# Patient Record
Sex: Female | Born: 1995 | Race: Black or African American | Hispanic: No | Marital: Single | Smoking: Never smoker
Health system: Southern US, Community
[De-identification: ages and names within clinical notes are randomized; demographics above are authoritative.]

## PROBLEM LIST (undated history)

## (undated) DIAGNOSIS — A749 Chlamydial infection, unspecified: Secondary | ICD-10-CM

## (undated) DIAGNOSIS — B9689 Other specified bacterial agents as the cause of diseases classified elsewhere: Secondary | ICD-10-CM

## (undated) DIAGNOSIS — N760 Acute vaginitis: Secondary | ICD-10-CM

## (undated) DIAGNOSIS — R519 Headache, unspecified: Secondary | ICD-10-CM

## (undated) DIAGNOSIS — J45909 Unspecified asthma, uncomplicated: Secondary | ICD-10-CM

## (undated) HISTORY — PX: HEMORRHOID SURGERY: SHX153

---

## 2016-05-08 ENCOUNTER — Encounter (HOSPITAL_COMMUNITY): Payer: Self-pay

## 2016-05-08 ENCOUNTER — Emergency Department (HOSPITAL_COMMUNITY): Payer: Medicaid Other

## 2016-05-08 ENCOUNTER — Emergency Department (HOSPITAL_COMMUNITY)
Admission: EM | Admit: 2016-05-08 | Discharge: 2016-05-09 | Disposition: A | Discharge status: Home / Self Care | Payer: Medicaid Other | Attending: Emergency Medicine | Admitting: Emergency Medicine

## 2016-05-08 DIAGNOSIS — R0789 Other chest pain: Secondary | ICD-10-CM

## 2016-05-08 DIAGNOSIS — R079 Chest pain, unspecified: Secondary | ICD-10-CM | POA: Diagnosis present

## 2016-05-08 DIAGNOSIS — J45909 Unspecified asthma, uncomplicated: Secondary | ICD-10-CM | POA: Insufficient documentation

## 2016-05-08 HISTORY — DX: Unspecified asthma, uncomplicated: J45.909

## 2016-05-08 LAB — CBC
HEMATOCRIT: 36.9 % (ref 36.0–46.0)
Hemoglobin: 11.8 g/dL — ABNORMAL LOW (ref 12.0–15.0)
MCH: 24.9 pg — ABNORMAL LOW (ref 26.0–34.0)
MCHC: 32 g/dL (ref 30.0–36.0)
MCV: 77.8 fL — ABNORMAL LOW (ref 78.0–100.0)
PLATELETS: 99 10*3/uL — AB (ref 150–400)
RBC: 4.74 MIL/uL (ref 3.87–5.11)
RDW: 14.5 % (ref 11.5–15.5)
WBC: 4.9 10*3/uL (ref 4.0–10.5)

## 2016-05-08 LAB — BASIC METABOLIC PANEL
Anion gap: 9 (ref 5–15)
BUN: 11 mg/dL (ref 6–20)
CO2: 21 mmol/L — ABNORMAL LOW (ref 22–32)
CREATININE: 0.85 mg/dL (ref 0.44–1.00)
Calcium: 9.3 mg/dL (ref 8.9–10.3)
Chloride: 108 mmol/L (ref 101–111)
Glucose, Bld: 93 mg/dL (ref 65–99)
POTASSIUM: 4 mmol/L (ref 3.5–5.1)
SODIUM: 138 mmol/L (ref 135–145)

## 2016-05-08 LAB — URINALYSIS, ROUTINE W REFLEX MICROSCOPIC
BILIRUBIN URINE: NEGATIVE
GLUCOSE, UA: NEGATIVE mg/dL
KETONES UR: 15 mg/dL — AB
Leukocytes, UA: NEGATIVE
Nitrite: NEGATIVE
PROTEIN: NEGATIVE mg/dL
Specific Gravity, Urine: 1.029 (ref 1.005–1.030)
pH: 6 (ref 5.0–8.0)

## 2016-05-08 LAB — URINE MICROSCOPIC-ADD ON

## 2016-05-08 LAB — POC URINE PREG, ED: PREG TEST UR: NEGATIVE

## 2016-05-08 LAB — I-STAT TROPONIN, ED: Troponin i, poc: 0.01 ng/mL (ref 0.00–0.08)

## 2016-05-08 NOTE — ED Triage Notes (Addendum)
Pt states that she has been having CP that has been going on for a while, worse today. Pt states that she was seen in the ER two weeks ago and sent to a pulmonary specialist. Also having SOB with some wheezing. Denies radiation or n/v. Pt also adds that she has been having vaginal bleeding for the past month, recently had BV and wants to be checked for it again

## 2016-05-09 MED ORDER — IBUPROFEN 600 MG PO TABS: 600.0000 mg | ORAL_TABLET | Freq: Three times a day (TID) | ORAL | 0 refills | Status: AC

## 2016-05-09 MED ORDER — IBUPROFEN 400 MG PO TABS
600.0000 mg | ORAL_TABLET | Freq: Once | ORAL | Status: AC
  Administered 2016-05-09: 600 mg via ORAL
  Filled 2016-05-09: qty 1

## 2016-05-09 MED ORDER — METRONIDAZOLE 500 MG PO TABS
500.0000 mg | ORAL_TABLET | Freq: Once | ORAL | Status: AC
  Administered 2016-05-09: 500 mg via ORAL
  Filled 2016-05-09: qty 1

## 2016-05-09 MED ORDER — METRONIDAZOLE 500 MG PO TABS: 500.0000 mg | ORAL_TABLET | Freq: Two times a day (BID) | ORAL | 0 refills | Status: DC

## 2016-05-09 NOTE — ED Provider Notes (Signed)
[facility] Provider Note   CSN: [id number] Arrival date & time: 05/08/16  2245     History   Chief Complaint Chief Complaint  Patient presents with  . Asthma  . Chest Pain    HPI Anna Glenn is a 20 y.o. female.  HPI Patient presents with concern of chest wall pain, dyspnea. She also has complaint of vaginal discharge. Patient has a history of asthma, takes multiple medication, saw her pulmonologist within the past month. She notes that over the past few days, with increased band practice at [facility] she has had persistent chest wall discomfort, dyspnea, in spite of using her medication. She notes that she was seen in another emergency department several weeks ago, discharge with increased medication, pulmonology follow-up, but her symptoms have been persistent since that time. No fever, chills, nausea, vomiting, lightheadedness, syncope, abdominal pain, other changes. Patient also notes history of prior bacterial vaginosis, as well as chlamydia Patient took antibiotics after her diagnosis of STD, has had no additional protected sex. She now states that she has similar discharge to her output when she had bacterial vaginosis. No vaginal pain, dysuria, hematuria.  Past Medical History:  Diagnosis Date  . Asthma     There are no active problems to display for this patient.   History reviewed. No pertinent surgical history.  OB History    No data available       Home Medications    Prior to Admission medications   Not on File    Family History No family history on file.  Social History Social History  Substance Use Topics  . Smoking status: Never Smoker  . Smokeless tobacco: Never Used  . Alcohol use No     Allergies   Review of patient's allergies indicates no known allergies.   Review of Systems Review of Systems  Constitutional:       Per HPI, otherwise negative  HENT:       Per HPI, otherwise negative  Respiratory:       Per  HPI, otherwise negative  Cardiovascular:       Per HPI, otherwise negative  Gastrointestinal: Negative for vomiting.  Endocrine:       Negative aside from HPI  Genitourinary:       Neg aside from HPI   Musculoskeletal:       Per HPI, otherwise negative  Skin: Negative.   Neurological: Negative for syncope and weakness.     Physical Exam Updated Vital Signs BP 123/82   Pulse 72   Temp 98.3 F (36.8 C) (Oral)   Resp 19   Ht 5\' 6"  (1.676 m)   Wt 203 lb (92.1 kg)   LMP 04/04/2016   SpO2 100%   BMI 32.77 kg/m   Physical Exam  Constitutional: She is oriented to person, place, and time. She appears well-developed and well-nourished. No distress.  HENT:  Head: Normocephalic and atraumatic.  Eyes: Conjunctivae and EOM are normal.  Cardiovascular: Normal rate and regular rhythm.   Pulmonary/Chest: Effort normal and breath sounds normal. No stridor. No respiratory distress.  Lung sounds audible in all fields, clear. Patient has tenderness to palpation throughout the anterior thorax  Abdominal: She exhibits no distension.  Musculoskeletal: She exhibits no edema.  Neurological: She is alert and oriented to person, place, and time. No cranial nerve deficit.  Skin: Skin is warm and dry.  Psychiatric: She has a normal mood and affect.  Nursing note and vitals reviewed.    ED Treatments /  Results  Labs (all labs ordered are listed, but only abnormal results are displayed) Labs Reviewed  BASIC METABOLIC PANEL - Abnormal; Notable for the following:       Result Value   CO2 21 (*)    All other components within normal limits  CBC - Abnormal; Notable for the following:    Hemoglobin 11.8 (*)    MCV 77.8 (*)    MCH 24.9 (*)    Platelets 99 (*)    All other components within normal limits  URINALYSIS, ROUTINE W REFLEX MICROSCOPIC ([facility]) - Abnormal; Notable for the following:    Hgb urine dipstick SMALL (*)    Ketones, ur 15 (*)    All other components within normal  limits  URINE MICROSCOPIC-ADD ON - Abnormal; Notable for the following:    Squamous Epithelial / LPF 6-30 (*)    Bacteria, UA FEW (*)    All other components within normal limits  I-STAT TROPOININ, ED  POC URINE PREG, ED    EKG  EKG Interpretation  Date/Time:  Thursday May 08 2016 22:57:06 EDT Ventricular Rate:  70 PR Interval:  158 QRS Duration: 88 QT Interval:  398 QTC Calculation: 429 R Axis:   69 Text Interpretation:  Normal sinus rhythm Normal ECG Normal ECG Reconfirmed by Gerhard MunchLOCKWOOD, Kemper Hochman  MD [phone]([id number]) on 05/09/2016 12:18:30 AM       Radiology Dg Chest 2 View  Result Date: 05/08/2016 CLINICAL DATA:  Chest pain and dyspnea for 1 week EXAM: CHEST  2 VIEW COMPARISON:  None. FINDINGS: The heart size and mediastinal contours are within normal limits. Both lungs are clear. The visualized skeletal structures are unremarkable. IMPRESSION: No active cardiopulmonary disease. Electronically Signed   By: Tollie Ethavid  Kwon M.D.   On: 05/08/2016 23:47    Procedures Procedures (including critical care time)  Medications Ordered in ED Medications  ibuprofen (ADVIL,MOTRIN) tablet 600 mg (not administered)  metroNIDAZOLE (FLAGYL) tablet 500 mg (not administered)     Initial Impression / Assessment and Plan / ED Course  I have reviewed the triage vital signs and the nursing notes.  Pertinent labs & imaging results that were available during my care of the patient were reviewed by me and considered in my medical decision making (see chart for details).  Integument history of asthma, as well as bacterial vaginosis presents with concern for ongoing chest pain, dyspnea. Here she is awake and alert, with no hypoxia, no tachypnea, tachycardia, clinical or x-ray evidence of pneumonia. Patient's clear lung sounds, normal saturation, and absence of tachypnea all reassuring for low suspicion of acute asthma exacerbation. Tenderness to palpation suggests musculoskeletal inflammation. Patient  started on anti-inflammatories. In addition, given the patient's eruption of discharge, without other substantial vaginal complaints, abdominal pain, she was started empirically on therapy for bacterial vaginosis, discharged in stable condition to follow-up with primary care and pulmonology.    Gerhard Munchobert Magdelene Ruark, MD 05/09/16 [phone]

## 2016-05-09 NOTE — ED Notes (Signed)
EDP at bedside  

## 2016-05-09 NOTE — Discharge Instructions (Signed)
As discussed, your evaluation today has been largely reassuring.  But, it is important that you monitor your condition carefully, and do not hesitate to return to the ED if you develop new, or concerning changes in your condition. ? ?Otherwise, please follow-up with your physician for appropriate ongoing care. ? ?

## 2016-11-18 ENCOUNTER — Encounter (HOSPITAL_COMMUNITY): Payer: Self-pay | Admitting: Emergency Medicine

## 2016-11-18 ENCOUNTER — Emergency Department (HOSPITAL_COMMUNITY)
Admission: EM | Admit: 2016-11-18 | Discharge: 2016-11-18 | Disposition: A | Discharge status: Home / Self Care | Payer: Medicaid Other | Attending: Emergency Medicine | Admitting: Emergency Medicine

## 2016-11-18 ENCOUNTER — Emergency Department (HOSPITAL_COMMUNITY): Payer: Medicaid Other

## 2016-11-18 DIAGNOSIS — Y939 Activity, unspecified: Secondary | ICD-10-CM | POA: Diagnosis not present

## 2016-11-18 DIAGNOSIS — Y999 Unspecified external cause status: Secondary | ICD-10-CM | POA: Diagnosis not present

## 2016-11-18 DIAGNOSIS — J45909 Unspecified asthma, uncomplicated: Secondary | ICD-10-CM | POA: Insufficient documentation

## 2016-11-18 DIAGNOSIS — S99912A Unspecified injury of left ankle, initial encounter: Secondary | ICD-10-CM | POA: Insufficient documentation

## 2016-11-18 DIAGNOSIS — M25572 Pain in left ankle and joints of left foot: Secondary | ICD-10-CM

## 2016-11-18 DIAGNOSIS — Y92410 Unspecified street and highway as the place of occurrence of the external cause: Secondary | ICD-10-CM | POA: Diagnosis not present

## 2016-11-18 LAB — I-STAT CHEM 8, ED
BUN: 8 mg/dL (ref 6–20)
CREATININE: 0.8 mg/dL (ref 0.44–1.00)
Calcium, Ion: 1.12 mmol/L — ABNORMAL LOW (ref 1.15–1.40)
Chloride: 107 mmol/L (ref 101–111)
GLUCOSE: 96 mg/dL (ref 65–99)
HEMATOCRIT: 41 % (ref 36.0–46.0)
Hemoglobin: 13.9 g/dL (ref 12.0–15.0)
Potassium: 3.8 mmol/L (ref 3.5–5.1)
Sodium: 137 mmol/L (ref 135–145)
TCO2: 22 mmol/L (ref 0–100)

## 2016-11-18 LAB — I-STAT BETA HCG BLOOD, ED (MC, WL, AP ONLY)

## 2016-11-18 MED ORDER — IBUPROFEN 800 MG PO TABS: 800.0000 mg | ORAL_TABLET | Freq: Three times a day (TID) | ORAL | 0 refills | Status: DC | PRN

## 2016-11-18 NOTE — ED Notes (Signed)
Patient transported to X-ray 

## 2016-11-18 NOTE — Discharge Instructions (Signed)
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
## 2016-11-18 NOTE — ED Notes (Signed)
Pt returns from xray resting quietly with family at bedside

## 2016-11-18 NOTE — ED Notes (Signed)
Pt states she understands instructions. Home stable with family\. 

## 2017-05-19 ENCOUNTER — Ambulatory Visit (HOSPITAL_COMMUNITY)
Admission: EM | Admit: 2017-05-19 | Discharge: 2017-05-19 | Disposition: A | Discharge status: Home / Self Care | Payer: Medicaid Other | Attending: Family Medicine | Admitting: Family Medicine

## 2017-05-19 ENCOUNTER — Encounter (HOSPITAL_COMMUNITY): Payer: Self-pay | Admitting: *Deleted

## 2017-05-19 DIAGNOSIS — R3989 Other symptoms and signs involving the genitourinary system: Secondary | ICD-10-CM | POA: Diagnosis not present

## 2017-05-19 DIAGNOSIS — Z79899 Other long term (current) drug therapy: Secondary | ICD-10-CM | POA: Insufficient documentation

## 2017-05-19 DIAGNOSIS — R102 Pelvic and perineal pain: Secondary | ICD-10-CM | POA: Insufficient documentation

## 2017-05-19 DIAGNOSIS — N949 Unspecified condition associated with female genital organs and menstrual cycle: Secondary | ICD-10-CM

## 2017-05-19 DIAGNOSIS — Z7951 Long term (current) use of inhaled steroids: Secondary | ICD-10-CM | POA: Diagnosis not present

## 2017-05-19 DIAGNOSIS — J45909 Unspecified asthma, uncomplicated: Secondary | ICD-10-CM | POA: Insufficient documentation

## 2017-05-19 MED ORDER — VALACYCLOVIR HCL 1 G PO TABS: 1000.0000 mg | ORAL_TABLET | Freq: Two times a day (BID) | ORAL | 0 refills | Status: AC

## 2017-05-19 NOTE — ED Triage Notes (Signed)
Patient reports vaginal sores. Patient sexually active does not always use condoms.

## 2017-05-19 NOTE — ED Notes (Signed)
At bedside for evaluation of genital area.  Randall Hiss, NP obtained hsv specimen and this nurse labeled sample and placed in lab.  Site was right labia

## 2017-05-19 NOTE — ED Provider Notes (Signed)
[facility]    CSN: [id number] Arrival date & time: 05/19/17  1042     History   Chief Complaint Chief Complaint  Patient presents with  . Vaginal Pain    HPI Anna Glenn is a 21 y.o. female.   21 year old female presents with right sided lower labial/vaginal pain that started about 4 days ago. Has noticed a few "sores" in the area that are painful. Denies any distinct discharge from the sores. Denies any fever, abdominal pain or GI symptoms. Experiencing some dysuria when urine passes over the sores. Has one partner but does not use condoms. Currently being tx for BV- has history of recurrent BV and went to the [facility] center and was prescribed Metrogel over the weekend. On day 3 of medication and discharge is improving. Had a previous mild sore in the past but no distinct cold sores or oral lesions. Partner has no symptoms or no history of herpes. She has tried applying Vaseline to area for comfort and taken Ibuprofen as needed for pain. Other chronic health issues include allergies and asthma- on medication daily for management.    The history is provided by the patient.    Past Medical History:  Diagnosis Date  . Asthma     There are no active problems to display for this patient.   History reviewed. No pertinent surgical history.  OB History    No data available       Home Medications    Prior to Admission medications   Medication Sig Start Date End Date Taking? Authorizing Provider  albuterol (PROVENTIL HFA) 108 (90 Base) MCG/ACT inhaler Inhale 2 puffs into the lungs every 6 (six) hours as needed for wheezing or shortness of breath.   Yes [provider]  fluticasone (FLONASE) 50 MCG/ACT nasal spray Place 2 sprays into both nostrils daily.   Yes [provider]  metroNIDAZOLE (METROGEL) 0.75 % vaginal gel Place 1 Applicatorful vaginally at bedtime. 05/16/17 05/22/17 Yes [provider]  montelukast (SINGULAIR) 10 MG  tablet Take 10 mg by mouth at bedtime.   Yes [provider]  valACYclovir (VALTREX) 1000 MG tablet Take 1 tablet (1,000 mg total) by mouth 2 (two) times daily. 05/19/17 05/29/17  Sudie Grumbling, NP    Family History History reviewed. No pertinent family history.  Social History Social History  Substance Use Topics  . Smoking status: Never Smoker  . Smokeless tobacco: Never Used  . Alcohol use No     Allergies   Patient has no known allergies.   Review of Systems Review of Systems  Constitutional: Positive for fatigue. Negative for appetite change, chills and fever.  HENT: Negative for congestion, mouth sores, sore throat and trouble swallowing.   Respiratory: Negative for cough, chest tightness, shortness of breath and wheezing.   Gastrointestinal: Negative for abdominal pain, diarrhea, nausea and vomiting.  Genitourinary: Positive for dysuria, genital sores, vaginal discharge and vaginal pain. Negative for decreased urine volume, difficulty urinating, flank pain, frequency, hematuria, urgency and vaginal bleeding (labial pain).  Musculoskeletal: Negative for arthralgias, back pain and myalgias.  Skin: Positive for rash.  Allergic/Immunologic: Positive for environmental allergies. Negative for immunocompromised state.  Neurological: Negative for dizziness, tremors, syncope, weakness, light-headedness, numbness and headaches.  Hematological: Negative for adenopathy. Does not bruise/bleed easily.     Physical Exam Triage Vital Signs ED Triage Vitals  Enc Vitals Group     BP      Pulse  Resp      Temp      Temp src      SpO2      Weight      Height      Head Circumference      Peak Flow      Pain Score      Pain Loc      Pain Edu?      Excl. in GC?    No data found.   Updated Vital Signs LMP 05/04/2017   Visual Acuity Right Eye Distance:   Left Eye Distance:   Bilateral Distance:    Right Eye Near:   Left Eye Near:    Bilateral Near:       Physical Exam  Constitutional: She is oriented to person, place, and time. She appears well-developed and well-nourished. No distress.  HENT:  Head: Normocephalic and atraumatic.  Mouth/Throat: Oropharynx is clear and moist.  Eyes: Conjunctivae and EOM are normal.  Neck: Normal range of motion.  Cardiovascular: Normal rate, regular rhythm and normal heart sounds.   No murmur heard. Pulmonary/Chest: Effort normal and breath sounds normal. No respiratory distress. She has no decreased breath sounds. She has no wheezes. She has no rhonchi.  Abdominal: Soft. Bowel sounds are normal. There is no hepatosplenomegaly. There is no tenderness. There is no rigidity, no rebound, no guarding and no CVA tenderness. Hernia confirmed negative in the right inguinal area and confirmed negative in the left inguinal area.  Genitourinary:    Pelvic exam was performed with patient in the knee-chest position. There is tenderness and lesion on the right labia. There is no injury on the right labia.  Genitourinary Comments: Small pink to red vesicular lesions present on right outer labia. Tender. Minimal surrounding erythema. No distinct discharge. No lesions on left labia. Did not perform internal exam.   Musculoskeletal: Normal range of motion.  Lymphadenopathy:       Right: No inguinal adenopathy present.       Left: No inguinal adenopathy present.  Neurological: She is alert and oriented to person, place, and time.  Skin: Skin is warm and dry. Capillary refill takes less than 2 seconds. Rash noted. Rash is vesicular. There is erythema.  Psychiatric: She has a normal mood and affect. Her behavior is normal. Judgment and thought content normal.     UC Treatments / Results  Labs (all labs ordered are listed, but only abnormal results are displayed) Labs Reviewed  HSV CULTURE AND TYPING    EKG  EKG Interpretation None       Radiology No results found.  Procedures Procedures (including critical  care time)  Medications Ordered in UC Medications - No data to display   Initial Impression / Assessment and Plan / UC Course  I have reviewed the triage vital signs and the nursing notes.  Pertinent labs & imaging results that were available during my care of the patient were reviewed by me and considered in my medical decision making (see chart for details).    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 Patient started crying and had multiple questions and concerns. Reviewed that HSV culture is not 100% accurate and may consider HSV blood tests for additional confirmation if culture results are negative. Discussed that HSV can be passed when asymptomatic. Recommend start Valtrex 1g twice a day for 10 days. May take Ibuprofen  every 6 hours as needed for pain. Recommend cool compresses to area for comfort. Finish Metrogel as  directed. No sexual intercourse for at least 7 days. Recommend follow-up pending lab results.   Final Clinical Impressions(s) / UC Diagnoses   Final diagnoses:  Genital sore  Pain of female genitalia    New Prescriptions Discharge Medication List as of 05/19/2017  1:00 PM    START taking these medications   Details  valACYclovir (VALTREX) 1000 MG tablet Take 1 tablet (1,000 mg total) by mouth 2 (two) times daily., Starting Tue 05/19/2017, Until Fri 05/29/2017, Normal         Controlled Substance Prescriptions [place] Controlled Substance Registry consulted? Not Applicable   Sudie Grumbling, NP 05/19/17 1523

## 2017-05-19 NOTE — Discharge Instructions (Signed)
Recommend start Valtrex 1g twice a day as directed. May take Ibuprofen as needed for pain. Recommend no intercourse for at least 7 days. Finish the Metrogel for BV as directed. Follow-up pending lab results.

## 2017-05-21 LAB — HSV CULTURE AND TYPING

## 2017-09-20 ENCOUNTER — Other Ambulatory Visit: Payer: Self-pay

## 2017-09-20 ENCOUNTER — Encounter (HOSPITAL_BASED_OUTPATIENT_CLINIC_OR_DEPARTMENT_OTHER): Payer: Self-pay | Admitting: Emergency Medicine

## 2017-09-20 ENCOUNTER — Emergency Department (HOSPITAL_COMMUNITY)
Admission: EM | Admit: 2017-09-20 | Discharge: 2017-09-20 | Discharge status: Against Medical Advice | Payer: Medicaid Other | Source: Home / Self Care

## 2017-09-20 ENCOUNTER — Emergency Department (HOSPITAL_BASED_OUTPATIENT_CLINIC_OR_DEPARTMENT_OTHER)
Admission: EM | Admit: 2017-09-20 | Discharge: 2017-09-20 | Discharge status: Against Medical Advice | Payer: Medicaid Other | Attending: Emergency Medicine | Admitting: Emergency Medicine

## 2017-09-20 DIAGNOSIS — Z5321 Procedure and treatment not carried out due to patient leaving prior to being seen by health care provider: Secondary | ICD-10-CM | POA: Insufficient documentation

## 2017-09-20 DIAGNOSIS — N946 Dysmenorrhea, unspecified: Secondary | ICD-10-CM | POA: Insufficient documentation

## 2017-09-20 LAB — URINALYSIS, MICROSCOPIC (REFLEX)

## 2017-09-20 LAB — URINALYSIS, ROUTINE W REFLEX MICROSCOPIC
BILIRUBIN URINE: NEGATIVE
Glucose, UA: NEGATIVE mg/dL
Ketones, ur: NEGATIVE mg/dL
Leukocytes, UA: NEGATIVE
NITRITE: NEGATIVE
Protein, ur: 30 mg/dL — AB
pH: 6 (ref 5.0–8.0)

## 2017-09-20 LAB — PREGNANCY, URINE: PREG TEST UR: NEGATIVE

## 2017-09-20 NOTE — ED Notes (Signed)
Pt called for triage. No answer.

## 2017-09-20 NOTE — ED Triage Notes (Signed)
Patient states that she started her menstrual cycle yesterday and her cramping is worse than she has ever had  - patient reports that her bleeding is lighter than usual - Patient states that she is also have watery stools

## 2017-09-20 NOTE — ED Notes (Signed)
No answer when called to assess vitals.

## 2017-09-20 NOTE — ED Triage Notes (Signed)
Called for triage. No response. 

## 2017-09-21 ENCOUNTER — Encounter (HOSPITAL_COMMUNITY): Payer: Self-pay | Admitting: Emergency Medicine

## 2017-09-21 ENCOUNTER — Ambulatory Visit (HOSPITAL_COMMUNITY)
Admission: EM | Admit: 2017-09-21 | Discharge: 2017-09-21 | Disposition: A | Discharge status: Home / Self Care | Payer: Medicaid Other | Attending: Family Medicine | Admitting: Family Medicine

## 2017-09-21 DIAGNOSIS — R197 Diarrhea, unspecified: Secondary | ICD-10-CM | POA: Insufficient documentation

## 2017-09-21 DIAGNOSIS — N946 Dysmenorrhea, unspecified: Secondary | ICD-10-CM | POA: Diagnosis present

## 2017-09-21 DIAGNOSIS — R1084 Generalized abdominal pain: Secondary | ICD-10-CM

## 2017-09-21 DIAGNOSIS — J45909 Unspecified asthma, uncomplicated: Secondary | ICD-10-CM | POA: Diagnosis not present

## 2017-09-21 DIAGNOSIS — Z79899 Other long term (current) drug therapy: Secondary | ICD-10-CM | POA: Diagnosis not present

## 2017-09-21 DIAGNOSIS — R102 Pelvic and perineal pain: Secondary | ICD-10-CM | POA: Diagnosis not present

## 2017-09-21 DIAGNOSIS — R112 Nausea with vomiting, unspecified: Secondary | ICD-10-CM | POA: Diagnosis not present

## 2017-09-21 DIAGNOSIS — R42 Dizziness and giddiness: Secondary | ICD-10-CM | POA: Diagnosis not present

## 2017-09-21 LAB — CBC
HCT: 38.9 % (ref 36.0–46.0)
HEMOGLOBIN: 13.1 g/dL (ref 12.0–15.0)
MCH: 26.7 pg (ref 26.0–34.0)
MCHC: 33.7 g/dL (ref 30.0–36.0)
MCV: 79.4 fL (ref 78.0–100.0)
Platelets: 105 10*3/uL — ABNORMAL LOW (ref 150–400)
RBC: 4.9 MIL/uL (ref 3.87–5.11)
RDW: 14.6 % (ref 11.5–15.5)
WBC: 3.5 10*3/uL — ABNORMAL LOW (ref 4.0–10.5)

## 2017-09-21 MED ORDER — TRANEXAMIC ACID 650 MG PO TABS: 1300.0000 mg | ORAL_TABLET | Freq: Three times a day (TID) | ORAL | 0 refills | Status: DC

## 2017-09-21 MED ORDER — KETOROLAC TROMETHAMINE 60 MG/2ML IM SOLN
60.0000 mg | Freq: Once | INTRAMUSCULAR | Status: AC
  Administered 2017-09-21: 60 mg via INTRAMUSCULAR

## 2017-09-21 MED ORDER — CELECOXIB 200 MG PO CAPS: 200.0000 mg | ORAL_CAPSULE | Freq: Two times a day (BID) | ORAL | 1 refills | Status: DC

## 2017-09-21 MED ORDER — KETOROLAC TROMETHAMINE 60 MG/2ML IM SOLN
INTRAMUSCULAR | Status: AC
  Filled 2017-09-21: qty 2

## 2017-09-21 NOTE — Discharge Instructions (Addendum)
Please make sure you discuss using Lysteda with your PCP. You may use 1,300 mg 3 times daily (3,900 mg/day) for up to 5 days during monthly menstruation. Do not take any other NSAID including diclofenac, ibuprofen, naproxen, Aleve, Motrin, etc while taking Celebrex.

## 2017-09-21 NOTE — ED Provider Notes (Signed)
  MRN: [id number] DOB: 06/30/1996  Subjective:   Anna LeachBrianna Glenn is a 22 y.o. female presenting for 3 day history of severe menstrual cramps. Has had nausea, vomiting (5 episodes), lightheadedness, loose watery stools (3 per day). Patient uses heavy flow pads, does not use tampons. Has used high doses of Midol, ibuprofen, APAP with minimal relief. Patient used Nexplanon 08-05/2017, started OCP thereafter and stopped this 06/06/2017. Has a history of dysmenorrhea, heavy cycles. Denies history of fibroids, cysts. Denies fever, bloody stools, dysuria. Patient is sexually active, uses condoms for protection. Denies smoking cigarettes. Denies history of clots.   No current facility-administered medications for this encounter.   Current Outpatient Medications:  .  albuterol (PROVENTIL HFA) 108 (90 Base) MCG/ACT inhaler, Inhale 2 puffs into the lungs every 6 (six) hours as needed for wheezing or shortness of breath., Disp: , Rfl:  .  fluticasone (FLONASE) 50 MCG/ACT nasal spray, Place 2 sprays into both nostrils daily., Disp: , Rfl:  .  montelukast (SINGULAIR) 10 MG tablet, Take 10 mg by mouth at bedtime., Disp: , Rfl:   Also uses QVAR, Flonase.  [name] has No Known Allergies.  [name]  has a past medical history of Asthma. Denies past surgical history.  Objective:   Vitals: BP 122/62   Pulse 74   Temp 98.5 F (36.9 C) (Oral)   Resp 16   Ht 5\' 7"  (1.702 m)   Wt 203 lb (92.1 kg)   LMP 09/19/2017   SpO2 99%   BMI 31.79 kg/m   Physical Exam  Constitutional: She is oriented to person, place, and time. She appears well-developed and well-nourished.  Cardiovascular: Normal rate, regular rhythm and intact distal pulses. Exam reveals no gallop and no friction rub.  No murmur heard. Pulmonary/Chest: No respiratory distress. She has no wheezes. She has no rales.  Abdominal: Soft. Bowel sounds are normal. She exhibits no distension and no mass. There is tenderness (throughout). There is no guarding.   Neurological: She is alert and oriented to person, place, and time.  Skin: Skin is warm and dry.   Results for orders placed or performed during the hospital encounter of 09/21/17 (from the past 24 hour(s))  CBC     Status: Abnormal   Collection Time: 09/21/17 10:32 AM  Result Value Ref Range   WBC 3.5 (L) 4.0 - 10.5 K/uL   RBC 4.90 3.87 - 5.11 MIL/uL   Hemoglobin 13.1 12.0 - 15.0 g/dL   HCT 21.338.9 08.636.0 - 57.846.0 %   MCV 79.4 78.0 - 100.0 fL   MCH 26.7 26.0 - 34.0 pg   MCHC 33.7 30.0 - 36.0 g/dL   RDW 46.914.6 62.911.5 - 52.815.5 %   Platelets 105 (L) 150 - 400 K/uL   Assessment and Plan :   Dysmenorrhea  Pelvic pain in female  Nausea vomiting and diarrhea  Generalized abdominal pain  Lightheadedness  Patient will likely need pelvic U/S, recommended she check back with PCP or pursue this in the ER. IM Toradol done today. Discussed Lysteda with patient. I suspect nausea, vomiting, diarrhea is due to Midol, ibuprofen. Use Celebrex for pain instead. Return-to-clinic precautions discussed, patient verbalized understanding.    [name], Gem Conkle, [place] JerseyPA-C 09/21/17 [id number]

## 2017-09-21 NOTE — ED Triage Notes (Signed)
PT reports her menstrual cycles are hard to handle. PT reports severe abdominal cramping, lightheaded when standing, and this time she is having diarrhea which is not normal. PT has tried hormonal birth control. But has side effects. Has tried midol, 800 mg ibuprofen, and tylenol 500 mg

## 2018-04-27 ENCOUNTER — Encounter (HOSPITAL_COMMUNITY): Payer: Self-pay | Admitting: Emergency Medicine

## 2018-04-27 ENCOUNTER — Other Ambulatory Visit: Payer: Self-pay

## 2018-04-27 ENCOUNTER — Emergency Department (HOSPITAL_COMMUNITY)
Admission: EM | Admit: 2018-04-27 | Discharge: 2018-04-28 | Disposition: A | Discharge status: Home / Self Care | Payer: Self-pay | Attending: Emergency Medicine | Admitting: Emergency Medicine

## 2018-04-27 DIAGNOSIS — Z5321 Procedure and treatment not carried out due to patient leaving prior to being seen by health care provider: Secondary | ICD-10-CM | POA: Insufficient documentation

## 2018-04-27 DIAGNOSIS — R109 Unspecified abdominal pain: Secondary | ICD-10-CM | POA: Insufficient documentation

## 2018-04-27 LAB — URINALYSIS, ROUTINE W REFLEX MICROSCOPIC
BILIRUBIN URINE: NEGATIVE
Glucose, UA: NEGATIVE mg/dL
Ketones, ur: 5 mg/dL — AB
Nitrite: NEGATIVE
PROTEIN: 100 mg/dL — AB
Specific Gravity, Urine: 1.035 — ABNORMAL HIGH (ref 1.005–1.030)
pH: 7 (ref 5.0–8.0)

## 2018-04-27 LAB — CBC
HEMATOCRIT: 40.1 % (ref 36.0–46.0)
HEMOGLOBIN: 12.5 g/dL (ref 12.0–15.0)
MCH: 25.9 pg — ABNORMAL LOW (ref 26.0–34.0)
MCHC: 31.2 g/dL (ref 30.0–36.0)
MCV: 83.2 fL (ref 78.0–100.0)
Platelets: 106 10*3/uL — ABNORMAL LOW (ref 150–400)
RBC: 4.82 MIL/uL (ref 3.87–5.11)
RDW: 14 % (ref 11.5–15.5)
WBC: 8 10*3/uL (ref 4.0–10.5)

## 2018-04-27 LAB — COMPREHENSIVE METABOLIC PANEL
ALBUMIN: 3.8 g/dL (ref 3.5–5.0)
ALT: 12 U/L (ref 0–44)
ANION GAP: 6 (ref 5–15)
AST: 16 U/L (ref 15–41)
Alkaline Phosphatase: 56 U/L (ref 38–126)
BUN: 8 mg/dL (ref 6–20)
CHLORIDE: 109 mmol/L (ref 98–111)
CO2: 27 mmol/L (ref 22–32)
Calcium: 9.4 mg/dL (ref 8.9–10.3)
Creatinine, Ser: 0.93 mg/dL (ref 0.44–1.00)
GFR calc Af Amer: >60 mL/min (ref >60)
GFR calc non Af Amer: >60 mL/min (ref >60)
GLUCOSE: 112 mg/dL — AB (ref 70–99)
POTASSIUM: 4.1 mmol/L (ref 3.5–5.1)
SODIUM: 142 mmol/L (ref 135–145)
Total Bilirubin: 0.5 mg/dL (ref 0.3–1.2)
Total Protein: 6.2 g/dL — ABNORMAL LOW (ref 6.5–8.1)

## 2018-04-27 LAB — LIPASE, BLOOD: LIPASE: 34 U/L (ref 11–51)

## 2018-04-27 LAB — I-STAT BETA HCG BLOOD, ED (MC, WL, AP ONLY)

## 2018-04-27 NOTE — ED Triage Notes (Signed)
Pt c/o lower abdominal pain x 2 days after taking azithromycin for chlamydia.

## 2018-04-28 ENCOUNTER — Ambulatory Visit (HOSPITAL_COMMUNITY)
Admission: EM | Admit: 2018-04-28 | Discharge: 2018-04-28 | Disposition: A | Discharge status: Other Acute Inpatient Hospital | Payer: Medicaid Other

## 2018-04-28 ENCOUNTER — Emergency Department (HOSPITAL_COMMUNITY)
Admission: EM | Admit: 2018-04-28 | Discharge: 2018-04-28 | Disposition: A | Discharge status: Home / Self Care | Payer: Medicaid Other | Attending: Emergency Medicine | Admitting: Emergency Medicine

## 2018-04-28 ENCOUNTER — Encounter (HOSPITAL_COMMUNITY): Payer: Self-pay | Admitting: Emergency Medicine

## 2018-04-28 DIAGNOSIS — Z79899 Other long term (current) drug therapy: Secondary | ICD-10-CM | POA: Insufficient documentation

## 2018-04-28 DIAGNOSIS — J45909 Unspecified asthma, uncomplicated: Secondary | ICD-10-CM | POA: Insufficient documentation

## 2018-04-28 DIAGNOSIS — R102 Pelvic and perineal pain: Secondary | ICD-10-CM | POA: Insufficient documentation

## 2018-04-28 HISTORY — DX: Chlamydial infection, unspecified: A74.9

## 2018-04-28 LAB — WET PREP, GENITAL
Clue Cells Wet Prep HPF POC: NONE SEEN
Sperm: NONE SEEN
Trich, Wet Prep: NONE SEEN
Yeast Wet Prep HPF POC: NONE SEEN

## 2018-04-28 NOTE — Discharge Instructions (Addendum)
Please read attached information. If you experience any new or worsening signs or symptoms please return to the emergency room for evaluation. Please follow-up with your primary care provider or specialist as discussed.  °

## 2018-04-28 NOTE — ED Triage Notes (Signed)
Pt presents to ED for assessment of lower abdominal pain x 2 days after taking her antibiotics for Chlamydia.  States the pain was so severe yesterday she was bent over on all fours in the bathroom.  States better today.  C/o vomiting yesterday morning, none today.  Denies diarrhea.

## 2018-04-28 NOTE — ED Notes (Signed)
No answer for vitals reassess.

## 2018-04-28 NOTE — ED Notes (Signed)
Pt stable, ambulatory, states understanding of discharge instructions 

## 2018-04-28 NOTE — ED Provider Notes (Signed)
[facility] EMERGENCY DEPARTMENT Provider Note   CSN: [id number] Arrival date & time: 04/28/18  1058     History   Chief Complaint Chief Complaint  Patient presents with  . Abdominal Pain    HPI Anna Glenn is a 22 y.o. female.  HPI   22 year old female presents today with complaints of lower abdominal pain.  Patient notes a 2-week history of aching intermittent pain in the pelvis.  Patient notes that she has been using heating pad, shower and using tea as well.  She notes that symptoms had slowly gotten worse.  She was seen at her health center for school, she had exam that was positive for chlamydia and was treated with azithromycin.  Patient notes that the symptoms worsened after treatment the azithromycin, but have improved today.  She notes she continues to have discharge and odor.  Denies any nausea vomiting, denies any fever.  She denies any burning with urination.  She notes she is sexually active but has not had sex since her last STD test.  She denies any upper abdominal pain.   Past Medical History:  Diagnosis Date  . Asthma   . Chlamydia     There are no active problems to display for this patient.   History reviewed. No pertinent surgical history.   OB History   None      Home Medications    Prior to Admission medications   Medication Sig Start Date End Date Taking? Authorizing Provider  albuterol (PROVENTIL HFA) 108 (90 Base) MCG/ACT inhaler Inhale 2 puffs into the lungs every 6 (six) hours as needed for wheezing or shortness of breath.    [provider]  celecoxib (CELEBREX) 200 MG capsule Take 1 capsule (200 mg total) by mouth 2 (two) times daily. 09/21/17   Wallis Bamberg, PA-C  fluticasone (FLONASE) 50 MCG/ACT nasal spray Place 2 sprays into both nostrils daily.    [provider]  montelukast (SINGULAIR) 10 MG tablet Take 10 mg by mouth at bedtime.    [provider]  tranexamic acid (LYSTEDA) 650 MG TABS  tablet Take 2 tablets (1,300 mg total) by mouth 3 (three) times daily. 09/21/17   Wallis Bamberg, PA-C    Family History History reviewed. No pertinent family history.  Social History Social History   Tobacco Use  . Smoking status: Never Smoker  . Smokeless tobacco: Never Used  Substance Use Topics  . Alcohol use: No  . Drug use: Not on file     Allergies   Patient has no known allergies.   Review of Systems Review of Systems  All other systems reviewed and are negative.    Physical Exam Updated Vital Signs BP 129/81 (BP Location: Right Arm)   Pulse 63   Temp 98.4 F (36.9 C) (Oral)   Resp 18   SpO2 100%   Physical Exam  Constitutional: She is oriented to person, place, and time. She appears well-developed and well-nourished.  HENT:  Head: Normocephalic and atraumatic.  Eyes: Pupils are equal, round, and reactive to light. Conjunctivae are normal. Right eye exhibits no discharge. Left eye exhibits no discharge. No scleral icterus.  Neck: Normal range of motion. No JVD present. No tracheal deviation present.  Pulmonary/Chest: Effort normal. No stridor.  Abdominal: Soft. She exhibits no distension and no mass. There is no tenderness. There is no rebound and no guarding. No hernia.  minor suprapubic ttp -remainder of abdomen soft nontender  Neurological: She is alert and oriented  to person, place, and time. Coordination normal.  Psychiatric: She has a normal mood and affect. Her behavior is normal. Judgment and thought content normal.  Nursing note and vitals reviewed.    ED Treatments / Results  Labs (all labs ordered are listed, but only abnormal results are displayed) Labs Reviewed  WET PREP, GENITAL - Abnormal; Notable for the following components:      Result Value   WBC, Wet Prep HPF POC MANY (*)    All other components within normal limits  GC/CHLAMYDIA PROBE AMP ([place]) NOT AT [facility]    EKG None  Radiology No results  found.  Procedures Procedures (including critical care time)  Medications Ordered in ED Medications - No data to display   Initial Impression / Assessment and Plan / ED Course  I have reviewed the triage vital signs and the nursing notes.  Pertinent labs & imaging results that were available during my care of the patient were reviewed by me and considered in my medical decision making (see chart for details).     Labs: CBC, CMP, lipase, urinalysis, i-STAT beta-hCG performed yesterday (wet prep, GC performed today)   Imaging:  Consults:  Therapeutics:  Discharge Meds:   Assessment/Plan: 22 year old female presents today with complaints of pelvic pain.  Patient has small amount of vaginal discharge, she was treated for chlamydia with azithromycin.  Patient notes remainder of her STD testing was negative.  She has not been sexually active since then.  Patient has no signs of pelvic inflammatory disease, intra-abdominal pathology presently.  She had labs performed yesterday were reassuring with no significant abnormalities.  Patient will be discharged with symptomatic care instructions, GC pending today.  No indication for prophylactic treatment at this time.  Strict return precautions given, she verbalized understanding and agreement to today's plan had no further questions or concerns.      Final Clinical Impressions(s) / ED Diagnoses   Final diagnoses:  Pelvic pain    ED Discharge Orders    None       Rosalio Loud 04/28/18 1551    Mancel Bale, MD 04/30/18 [phone]

## 2018-04-28 NOTE — ED Notes (Signed)
Patient here yesterday.  Left AMA.  Will not re-order labs/urine

## 2018-04-29 LAB — GC/CHLAMYDIA PROBE AMP (~~LOC~~) NOT AT ARMC
Chlamydia: POSITIVE — AB
Neisseria Gonorrhea: NEGATIVE

## 2018-08-01 ENCOUNTER — Emergency Department (HOSPITAL_COMMUNITY): Payer: BLUE CROSS/BLUE SHIELD

## 2018-08-01 ENCOUNTER — Emergency Department (HOSPITAL_COMMUNITY)
Admission: EM | Admit: 2018-08-01 | Discharge: 2018-08-01 | Disposition: A | Discharge status: Home / Self Care | Payer: BLUE CROSS/BLUE SHIELD | Attending: Emergency Medicine | Admitting: Emergency Medicine

## 2018-08-01 ENCOUNTER — Other Ambulatory Visit: Payer: Self-pay

## 2018-08-01 DIAGNOSIS — Z79899 Other long term (current) drug therapy: Secondary | ICD-10-CM | POA: Diagnosis not present

## 2018-08-01 DIAGNOSIS — J111 Influenza due to unidentified influenza virus with other respiratory manifestations: Secondary | ICD-10-CM | POA: Diagnosis not present

## 2018-08-01 DIAGNOSIS — J45909 Unspecified asthma, uncomplicated: Secondary | ICD-10-CM | POA: Diagnosis not present

## 2018-08-01 DIAGNOSIS — R509 Fever, unspecified: Secondary | ICD-10-CM | POA: Diagnosis present

## 2018-08-01 MED ORDER — IBUPROFEN 600 MG PO TABS: 600.0000 mg | ORAL_TABLET | Freq: Four times a day (QID) | ORAL | 0 refills | Status: DC | PRN

## 2018-08-01 MED ORDER — BENZONATATE 100 MG PO CAPS: 100.0000 mg | ORAL_CAPSULE | Freq: Three times a day (TID) | ORAL | 0 refills | Status: DC

## 2018-08-01 MED ORDER — OSELTAMIVIR PHOSPHATE 75 MG PO CAPS: 75.0000 mg | ORAL_CAPSULE | Freq: Two times a day (BID) | ORAL | 0 refills | Status: DC

## 2018-08-01 NOTE — ED Provider Notes (Signed)
[facility] Provider Note   CSN: [id number] Arrival date & time: 08/01/18  1444     History   Chief Complaint No chief complaint on file.   HPI Anna LeachBrianna Glenn is a 22 y.o. female.  The history is provided by the patient. No language interpreter was used.     22 year old female presented with flu sxs.  Pt report for the past 3-4 days she has had fever, chills, boday aches, congestion, productive cough, chest discomfort, headache.  She also having mild lower abdominal cramping and started on her menstrual cycle.  She has been using her inhaler at home but no other specific treatment tried.  Pt mentioned being diagnosed with pna in October, was prescribed abx but could not afford it at that time.  Sts she has been getting sick 5 times since.  She did not have her flu shot.  She is currently in school.  No n/v/d.   Past Medical History:  Diagnosis Date  . Asthma   . Chlamydia     There are no active problems to display for this patient.   No past surgical history on file.   OB History   No obstetric history on file.      Home Medications    Prior to Admission medications   Medication Sig Start Date End Date Taking? Authorizing Provider  albuterol (PROVENTIL HFA) 108 (90 Base) MCG/ACT inhaler Inhale 2 puffs into the lungs every 6 (six) hours as needed for wheezing or shortness of breath.    [provider]  celecoxib (CELEBREX) 200 MG capsule Take 1 capsule (200 mg total) by mouth 2 (two) times daily. Patient not taking: Reported on 04/28/2018 09/21/17   [name], Mario, PA-C  fluticasone [facility](FLONASE) 50 MCG/ACT nasal spray Place 2 sprays into both nostrils daily.    [provider]  montelukast (SINGULAIR) 10 MG tablet Take 10 mg by mouth at bedtime.    [provider]  tranexamic acid (LYSTEDA) 650 MG TABS tablet Take 2 tablets (1,300 mg total) by mouth 3 (three) times daily. Patient not taking: Reported on  04/28/2018 09/21/17   [name], Mario, PA-C    Family History No family history on file.  Social History Social History   Tobacco Use  . Smoking status: Never Smoker  . Smokeless tobacco: Never Used  Substance Use Topics  . Alcohol use: No  . Drug use: Not on file     Allergies   Patient has no known allergies.   Review of Systems Review of Systems  All other systems reviewed and are negative.    Physical Exam Updated Vital Signs BP 137/67 (BP Location: Left Arm)   Pulse 65   Temp 98.3 F (36.8 C) (Oral)   Resp 18   Ht 5\' 7"  (1.702 m)   Wt 104.3 kg   LMP 07/31/2018 (Exact Date)   SpO2 100%   BMI 36.02 kg/m   Physical Exam Vitals signs and nursing note reviewed.  Constitutional:      General: She is not in acute distress.    Appearance: She is well-developed.  HENT:     Head: Atraumatic.     Right Ear: Tympanic membrane normal.     Left Ear: Tympanic membrane normal.     Nose: Nose normal.     Mouth/Throat:     Mouth: Mucous membranes are moist.  Eyes:     Conjunctiva/sclera: Conjunctivae normal.  Neck:     Musculoskeletal: Neck  supple. No neck rigidity.  Cardiovascular:     Rate and Rhythm: Normal rate and regular rhythm.     Pulses: Normal pulses.     Heart sounds: No murmur.  Pulmonary:     Effort: Pulmonary effort is normal.     Breath sounds: Normal breath sounds. No wheezing.  Abdominal:     General: Abdomen is flat.     Palpations: Abdomen is soft.     Tenderness: There is no abdominal tenderness.  Skin:    Findings: No rash.  Neurological:     Mental Status: She is alert.      ED Treatments / Results  Labs (all labs ordered are listed, but only abnormal results are displayed) Labs Reviewed - No data to display  EKG None  Radiology Dg Chest 2 View  Result Date: 08/01/2018 CLINICAL DATA:  Central sharp chest pain and shortness of breath. Productive cough for 4 days. History of pneumonia in October. History of asthma. Nonsmoker.  EXAM: CHEST - 2 VIEW COMPARISON:  05/08/2016 FINDINGS: The heart size and mediastinal contours are within normal limits. Both lungs are clear. The visualized skeletal structures are unremarkable. IMPRESSION: No active cardiopulmonary disease. Electronically Signed   By: Norva PavlovElizabeth  Brown M.D.   On: 08/01/2018 16:56    Procedures Procedures (including critical care time)  Medications Ordered in ED Medications - No data to display   Initial Impression / Assessment and Plan / ED Course  I have reviewed the triage vital signs and the nursing notes.  Pertinent labs & imaging results that were available during my care of the patient were reviewed by me and considered in my medical decision making (see chart for details).    BP 137/67 (BP Location: Left Arm)   Pulse 65   Temp 98.3 F (36.8 C) (Oral)   Resp 18   Ht 5\' 7"  (1.702 m)   Wt 104.3 kg   LMP 07/31/2018 (Exact Date)   SpO2 100%   BMI 36.02 kg/m    Final Clinical Impressions(s) / ED Diagnoses   Final diagnoses:  Flu syndrome    ED Discharge Orders         Ordered    oseltamivir (TAMIFLU) 75 MG capsule  Every 12 hours     08/01/18 1716    benzonatate (TESSALON) 100 MG capsule  Every 8 hours     08/01/18 1716    ibuprofen (ADVIL,MOTRIN) 600 MG tablet  Every 6 hours PRN     08/01/18 1716         Patient with symptoms consistent with influenza.  Vitals are stable, low-grade fever.  No signs of dehydration, tolerating PO's.  Lungs are clear. Due to patient's presentation and physical exam a chest x-ray was not ordered bc likely diagnosis of flu.  Discussed the cost versus benefit of Tamiflu treatment with the patient.  The patient understands that symptoms are greater than the recommended 24-48 hour window of treatment.  Patient will be discharged with instructions to orally hydrate, rest, and use over-the-counter medications such as anti-inflammatories ibuprofen and Aleve for muscle aches and Tylenol for fever.  Patient will  also be given a cough suppressant.     Fayrene Helperran, Ardian Haberland, PA-C 08/01/18 1718    Gwyneth SproutPlunkett, [place], MD 08/01/18 [phone]

## 2018-08-01 NOTE — ED Triage Notes (Signed)
Pt presents with endorses chills, generalized body aches, chest pain, productive cough, and abdominal cramping. Started cycle yesterday. Was dx with pneumonia in October but couldn't but the abx so home medicated and has gotten sick 5 times since then.

## 2018-08-01 NOTE — ED Notes (Signed)
Pt waiting to go to xray

## 2018-08-01 NOTE — ED Notes (Signed)
Pt to xray

## 2018-08-13 ENCOUNTER — Encounter (HOSPITAL_COMMUNITY): Payer: Self-pay | Admitting: *Deleted

## 2018-08-13 ENCOUNTER — Emergency Department (HOSPITAL_COMMUNITY)
Admission: EM | Admit: 2018-08-13 | Discharge: 2018-08-13 | Disposition: A | Discharge status: Home / Self Care | Payer: Medicaid Other | Attending: Emergency Medicine | Admitting: Emergency Medicine

## 2018-08-13 ENCOUNTER — Other Ambulatory Visit: Payer: Self-pay

## 2018-08-13 DIAGNOSIS — Z202 Contact with and (suspected) exposure to infections with a predominantly sexual mode of transmission: Secondary | ICD-10-CM | POA: Insufficient documentation

## 2018-08-13 DIAGNOSIS — Z5321 Procedure and treatment not carried out due to patient leaving prior to being seen by health care provider: Secondary | ICD-10-CM | POA: Insufficient documentation

## 2018-08-13 HISTORY — DX: Other specified bacterial agents as the cause of diseases classified elsewhere: B96.89

## 2018-08-13 HISTORY — DX: Acute vaginitis: N76.0

## 2018-08-13 NOTE — ED Triage Notes (Addendum)
Pt was on Twitter and saw a previous sex partner (intercourse was over 1.5 year ago per pt) was positive for HIV. Pt requesting STD check with rapid HIV testing. Has had vaginal discharge with hx of BV and chlamydia . Denies any over symptoms.

## 2018-08-13 NOTE — ED Notes (Signed)
Pt called X2 for room No answer

## 2018-08-13 NOTE — ED Notes (Signed)
No answer for room 

## 2018-08-14 ENCOUNTER — Other Ambulatory Visit: Payer: Self-pay

## 2018-08-14 ENCOUNTER — Emergency Department (HOSPITAL_BASED_OUTPATIENT_CLINIC_OR_DEPARTMENT_OTHER)
Admission: EM | Admit: 2018-08-14 | Discharge: 2018-08-14 | Disposition: A | Discharge status: Home / Self Care | Payer: BLUE CROSS/BLUE SHIELD | Attending: Emergency Medicine | Admitting: Emergency Medicine

## 2018-08-14 ENCOUNTER — Encounter (HOSPITAL_BASED_OUTPATIENT_CLINIC_OR_DEPARTMENT_OTHER): Payer: Self-pay | Admitting: Emergency Medicine

## 2018-08-14 DIAGNOSIS — J45909 Unspecified asthma, uncomplicated: Secondary | ICD-10-CM | POA: Diagnosis not present

## 2018-08-14 DIAGNOSIS — Z79899 Other long term (current) drug therapy: Secondary | ICD-10-CM | POA: Diagnosis not present

## 2018-08-14 DIAGNOSIS — Z202 Contact with and (suspected) exposure to infections with a predominantly sexual mode of transmission: Secondary | ICD-10-CM | POA: Insufficient documentation

## 2018-08-14 DIAGNOSIS — N898 Other specified noninflammatory disorders of vagina: Secondary | ICD-10-CM | POA: Diagnosis not present

## 2018-08-14 LAB — PREGNANCY, URINE: PREG TEST UR: NEGATIVE

## 2018-08-14 LAB — WET PREP, GENITAL
CLUE CELLS WET PREP: NONE SEEN
Sperm: NONE SEEN
TRICH WET PREP: NONE SEEN
Yeast Wet Prep HPF POC: NONE SEEN

## 2018-08-14 MED ORDER — CEFTRIAXONE SODIUM 250 MG IJ SOLR
250.0000 mg | Freq: Once | INTRAMUSCULAR | Status: AC
  Administered 2018-08-14: 250 mg via INTRAMUSCULAR
  Filled 2018-08-14: qty 250

## 2018-08-14 MED ORDER — AZITHROMYCIN 250 MG PO TABS
1000.0000 mg | ORAL_TABLET | Freq: Once | ORAL | Status: AC
  Administered 2018-08-14: 1000 mg via ORAL
  Filled 2018-08-14: qty 4

## 2018-08-14 NOTE — ED Provider Notes (Signed)
[facility] EMERGENCY DEPARTMENT Provider Note   CSN: [id number] Arrival date & time: 08/14/18  7824     History   Chief Complaint Chief Complaint  Patient presents with  . Exposure to STD    HPI Anna Glenn is a 23 y.o. female with past medical history of asthma, bacterial vaginosis, chlamydia, presenting to the emergency department with complaint of vaginal discharge that began 1.5 weeks ago.  Patient states it is thick and malodorous.  She has a history of recurrent bacterial vaginosis, however states this feels slightly different.  She has associated suprapubic abdominal discomfort, worse when laying down at bedtime.  She has been treating with heating pads.  LMP was December 28.  Does not take contraceptives.  He is currently sexually active with men, intermittently uses protection.  She states she was last tested for STDs in September 2019, all negative.  Patient reports concern for HIV, a recent sexual partner about 1.5 years ago has been rumored to have HIV.  She denies fever, urinary symptoms, nausea, vomiting, abnormal vaginal bleeding, vaginal itching.  The history is provided by the patient.    Past Medical History:  Diagnosis Date  . Asthma   . BV (bacterial vaginosis)   . Chlamydia     There are no active problems to display for this patient.   History reviewed. No pertinent surgical history.   OB History   No obstetric history on file.      Home Medications    Prior to Admission medications   Medication Sig Start Date End Date Taking? Authorizing Provider  albuterol (PROVENTIL HFA) 108 (90 Base) MCG/ACT inhaler Inhale 2 puffs into the lungs every 6 (six) hours as needed for wheezing or shortness of breath.    [provider]  benzonatate (TESSALON) 100 MG capsule Take 1 capsule (100 mg total) by mouth every 8 (eight) hours. 08/01/18   Fayrene Helper, PA-C  celecoxib (CELEBREX) 200 MG capsule Take 1 capsule (200 mg total) by mouth 2 (two)  times daily. Patient not taking: Reported on 04/28/2018 09/21/17   Wallis Bamberg, PA-C  fluticasone [facility]) 50 MCG/ACT nasal spray Place 2 sprays into both nostrils daily.    [provider]  ibuprofen (ADVIL,MOTRIN) 600 MG tablet Take 1 tablet (600 mg total) by mouth every 6 (six) hours as needed. 08/01/18   Fayrene Helper, PA-C  montelukast (SINGULAIR) 10 MG tablet Take 10 mg by mouth at bedtime.    [provider]  oseltamivir (TAMIFLU) 75 MG capsule Take 1 capsule (75 mg total) by mouth every 12 (twelve) hours. 08/01/18   Fayrene Helper, PA-C  tranexamic acid (LYSTEDA) 650 MG TABS tablet Take 2 tablets (1,300 mg total) by mouth 3 (three) times daily. Patient not taking: Reported on 04/28/2018 09/21/17   Wallis Bamberg, PA-C    Family History History reviewed. No pertinent family history.  Social History Social History   Tobacco Use  . Smoking status: Never Smoker  . Smokeless tobacco: Never Used  Substance Use Topics  . Alcohol use: No  . Drug use: Not on file     Allergies   Patient has no known allergies.   Review of Systems Review of Systems  Constitutional: Negative for fever.  Gastrointestinal: Positive for abdominal pain. Negative for nausea and vomiting.  Genitourinary: Positive for pelvic pain and vaginal discharge. Negative for dysuria, flank pain, frequency, vaginal bleeding and vaginal pain.  Skin: Negative for rash.  All other systems reviewed and are negative.  Physical Exam Updated Vital Signs BP 125/70   Pulse (!) 59   Temp 98.4 F (36.9 C) (Oral)   Resp 16   Ht 5\' 7"  (1.702 m)   Wt 104.3 kg   LMP 07/31/2018 (Exact Date)   SpO2 98%   BMI 36.02 kg/m   Physical Exam Vitals signs and nursing note reviewed. Exam conducted with a chaperone present.  Constitutional:      General: She is not in acute distress.    Appearance: She is well-developed. She is obese. She is not ill-appearing.  HENT:     Head: Normocephalic and atraumatic.    Eyes:     Conjunctiva/sclera: Conjunctivae normal.  Cardiovascular:     Rate and Rhythm: Normal rate and regular rhythm.  Pulmonary:     Effort: Pulmonary effort is normal.  Abdominal:     General: Bowel sounds are normal. There is no distension.     Palpations: Abdomen is soft.     Tenderness: There is no abdominal tenderness. There is no guarding.  Genitourinary:    Labia:        Right: No lesion.        Left: No lesion.      Cervix: Discharge and erythema present. No friability.     Uterus: Not tender.      Adnexa: Right adnexa normal and left adnexa normal.     Comments: Exam performed with female nurse tech chaperone present.  Small amount of white discharge.  Very mild vaginal erythema.  Reports mild discomfort with palpation of the cervix, however no chandelier sign. Skin:    General: Skin is warm.  Neurological:     Mental Status: She is alert.  Psychiatric:        Behavior: Behavior normal.      ED Treatments / Results  Labs (all labs ordered are listed, but only abnormal results are displayed) Labs Reviewed  WET PREP, GENITAL - Abnormal; Notable for the following components:      Result Value   WBC, Wet Prep HPF POC MANY (*)    All other components within normal limits  PREGNANCY, URINE  HIV ANTIBODY (ROUTINE TESTING W REFLEX)  RPR  GC/CHLAMYDIA PROBE AMP ([place]    EKG None  Radiology No results found.  Procedures Procedures (including critical care time)  Medications Ordered in ED Medications  cefTRIAXone (ROCEPHIN) injection 250 mg (250 mg Intramuscular Given 08/14/18 1115)  azithromycin (ZITHROMAX) tablet 1,000 mg (1,000 mg Oral Given 08/14/18 1112)     Initial Impression / Assessment and Plan / ED Course  I have reviewed the triage vital signs and the nursing notes.  Pertinent labs & imaging results that were available during my care of the patient were reviewed by me and considered in my medical decision making (see chart  for details).    Pt w possible STD exposure and vaginal discharge. Risky sexual behavior. Patient to be discharged with instructions to follow up with OBGYN vs health department. Discussed importance of using protection when sexually active. Pt understands that they have GC/Chlamydia cultures pending and that they will need to inform all sexual partners if results return positive. Pelvic exam reassuring. Low suspicion for PID. Due to pt's history, pelvic exam, and wet prep with increased WBCs, pt has been treated prophylactically with Azithromycin and Rocephin .  Pt afebrile and nontoxic, safe for discharge home.  Discussed results, findings, treatment and follow up. Patient advised of return precautions. Patient verbalized understanding  and agreed with plan.   Final Clinical Impressions(s) / ED Diagnoses   Final diagnoses:  Possible exposure to STD  Vaginal discharge    ED Discharge Orders    None       , [place] N, PA-C 08/14/18 1133    Pricilla Loveless, MD 08/15/18 0800

## 2018-08-14 NOTE — Discharge Instructions (Addendum)
Please read the instructions below.  You have been treated today for gonorrhea and chlamydia.   Talk with your primary care provider about any new medications. Please schedule an appointment for follow up with your OBGYN or primary care.  You will receive a call from the hospital if your test results come back positive. Avoid sexual activity until you know your test results. If your results come back positive, it is important that you inform all of your sexual partners. Return to the ER for new or worsening symptoms.

## 2018-08-14 NOTE — ED Notes (Signed)
ED Provider at bedside. 

## 2018-08-14 NOTE — ED Triage Notes (Signed)
Requesting to be checked for HIV from partner she had over 1.5 years ago.  States that she has no symptoms but saw this was posted on twitter.  Went to [facility] and left before being seen yesterday for same.  Additionally reports vaginal discharge for 1 week.

## 2018-08-15 LAB — RPR: RPR Ser Ql: NONREACTIVE

## 2018-08-15 LAB — HIV ANTIBODY (ROUTINE TESTING W REFLEX): HIV SCREEN 4TH GENERATION: NONREACTIVE

## 2018-08-16 LAB — GC/CHLAMYDIA PROBE AMP (~~LOC~~) NOT AT ARMC
Chlamydia: NEGATIVE
Neisseria Gonorrhea: NEGATIVE

## 2018-11-22 ENCOUNTER — Encounter (HOSPITAL_COMMUNITY): Payer: Self-pay | Admitting: Emergency Medicine

## 2018-11-22 ENCOUNTER — Other Ambulatory Visit: Payer: Self-pay

## 2018-11-22 ENCOUNTER — Emergency Department (HOSPITAL_COMMUNITY)
Admission: EM | Admit: 2018-11-22 | Discharge: 2018-11-22 | Disposition: A | Discharge status: Home / Self Care | Payer: BLUE CROSS/BLUE SHIELD | Attending: Emergency Medicine | Admitting: Emergency Medicine

## 2018-11-22 DIAGNOSIS — Z79899 Other long term (current) drug therapy: Secondary | ICD-10-CM | POA: Insufficient documentation

## 2018-11-22 DIAGNOSIS — J45909 Unspecified asthma, uncomplicated: Secondary | ICD-10-CM | POA: Insufficient documentation

## 2018-11-22 DIAGNOSIS — R51 Headache: Secondary | ICD-10-CM | POA: Insufficient documentation

## 2018-11-22 DIAGNOSIS — R519 Headache, unspecified: Secondary | ICD-10-CM

## 2018-11-22 LAB — I-STAT BETA HCG BLOOD, ED (MC, WL, AP ONLY): I-stat hCG, quantitative: <5 m[IU]/mL (ref <5)

## 2018-11-22 MED ORDER — PROCHLORPERAZINE EDISYLATE 10 MG/2ML IJ SOLN
10.0000 mg | Freq: Once | INTRAMUSCULAR | Status: AC
  Administered 2018-11-22: 10 mg via INTRAVENOUS
  Filled 2018-11-22: qty 2

## 2018-11-22 MED ORDER — SODIUM CHLORIDE 0.9 % IV BOLUS
1000.0000 mL | Freq: Once | INTRAVENOUS | Status: AC
  Administered 2018-11-22: 03:00:00 1000 mL via INTRAVENOUS

## 2018-11-22 MED ORDER — ACETAMINOPHEN 500 MG PO TABS
1000.0000 mg | ORAL_TABLET | Freq: Once | ORAL | Status: AC
  Administered 2018-11-22: 03:00:00 1000 mg via ORAL
  Filled 2018-11-22: qty 2

## 2018-11-22 NOTE — ED Triage Notes (Addendum)
Pt presents to ED c/o SOB and headache. Says the headache has been going on for 2 weeks that worsened today. Describes it as throbbing and tight 9/10. Shortness of breath and wheezing began today. Hx of asthma so she did he inhaler and nebulizer w/ some relief. Traveled to [place] the first week of March.

## 2018-11-22 NOTE — ED Provider Notes (Signed)
[facility] Provider Note   CSN: [id number] Arrival date & time: 11/22/18  0106    History   Chief Complaint Chief Complaint  Patient presents with  . Headache  . Shortness of Breath    HPI Anna Glenn is a 23 y.o. female.     HPI  Patient is a 23 year old female the past medical history of asthma, pectoral vaginosis, and chlamydia presenting for frontal headache and shortness of breath.  Patient's main complaint today is the headache.  She reports that she has had a daily headache for 2 weeks.  It does not interfere with sleeping and she is able to get good rest.  It is not positional.  She denies any visual disturbance, weakness or numbness of extremities, dizziness, vertigo, syncope, or problems with balance.  Patient denies any recent congestion, rhinorrhea, neck stiffness or tenderness.  No rashes.  Patient reports that she does have a history of headaches, but they are not usually this persistent or in this pattern.  Today she felt more nauseous than she has been over the past 2 weeks.  Patient denies taking any medications for symptoms.  She does report that she has been trying to treat the headaches with hot tea.  Regarding shortness of breath, patient reports that she does have a history of asthma and feels that the current pollen season is affecting her breathing.  She reports that she took a albuterol treatment today and felt much better.  She reports she had a slight dry cough today, but no cough productive of sputum or hemoptysis.  She does not take hormonal birth control. No VTE history.   Past Medical History:  Diagnosis Date  . Asthma   . BV (bacterial vaginosis)   . Chlamydia     There are no active problems to display for this patient.   History reviewed. No pertinent surgical history.   OB History   No obstetric history on file.      Home Medications    Prior to Admission medications   Medication Sig Start  Date End Date Taking? Authorizing Provider  albuterol (PROVENTIL HFA) 108 (90 Base) MCG/ACT inhaler Inhale 2 puffs into the lungs every 6 (six) hours as needed for wheezing or shortness of breath.    [provider]  benzonatate (TESSALON) 100 MG capsule Take 1 capsule (100 mg total) by mouth every 8 (eight) hours. 08/01/18   Fayrene Helperran, Bowie, PA-C  celecoxib (CELEBREX) 200 MG capsule Take 1 capsule (200 mg total) by mouth 2 (two) times daily. Patient not taking: Reported on 04/28/2018 09/21/17   [name], Mario, PA-C  fluticasone [facility](FLONASE) 50 MCG/ACT nasal spray Place 2 sprays into both nostrils daily.    [provider]  ibuprofen (ADVIL,MOTRIN) 600 MG tablet Take 1 tablet (600 mg total) by mouth every 6 (six) hours as needed. 08/01/18   Fayrene Helperran, Bowie, PA-C  montelukast (SINGULAIR) 10 MG tablet Take 10 mg by mouth at bedtime.    [provider]  oseltamivir (TAMIFLU) 75 MG capsule Take 1 capsule (75 mg total) by mouth every 12 (twelve) hours. 08/01/18   Fayrene Helperran, Bowie, PA-C  tranexamic acid (LYSTEDA) 650 MG TABS tablet Take 2 tablets (1,300 mg total) by mouth 3 (three) times daily. Patient not taking: Reported on 04/28/2018 09/21/17   [name], Mario, PA-C    Family History No family history on file.  Social History Social History   Tobacco Use  . Smoking status: Never Smoker  . Smokeless  tobacco: Never Used  Substance Use Topics  . Alcohol use: Yes    Alcohol/week: 2.0 standard drinks    Types: 2 Glasses of wine per week  . Drug use: Never     Allergies   Patient has no known allergies.   Review of Systems Review of Systems  Constitutional: Negative for chills and fever.  HENT: Negative for congestion, rhinorrhea, sinus pain and sore throat.   Eyes: Negative for visual disturbance.  Respiratory: Positive for shortness of breath. Negative for cough and chest tightness.   Cardiovascular: Negative for chest pain, palpitations and leg swelling.  Gastrointestinal: Positive  for nausea. Negative for abdominal pain, diarrhea and vomiting.  Genitourinary: Negative for dysuria and flank pain.  Musculoskeletal: Negative for back pain and myalgias.  Skin: Negative for rash.  Neurological: Positive for headaches. Negative for dizziness, syncope, speech difficulty, weakness, light-headedness and numbness.     Physical Exam Updated Vital Signs BP 122/84   Pulse 70   Temp 98.5 F (36.9 C) (Oral)   Resp 17   Ht  (1.702 m)   Wt 113.4 kg   LMP 11/14/2018 (Exact Date)   SpO2 100%   BMI 39.16 kg/m   Physical Exam Vitals signs and nursing note reviewed.  Constitutional:      General: She is not in acute distress.    Appearance: She is well-developed.  HENT:     Head: Normocephalic and atraumatic.  Eyes:     Conjunctiva/sclera: Conjunctivae normal.     Pupils: Pupils are equal, round, and reactive to light.  Neck:     Musculoskeletal: Normal range of motion and neck supple.     Comments: No nuchal rigidity. No meningismus.  Cardiovascular:     Rate and Rhythm: Normal rate and regular rhythm.     Heart sounds: S1 normal and S2 normal. No murmur.  Pulmonary:     Effort: Pulmonary effort is normal.     Breath sounds: Normal breath sounds. No wheezing or rales.  Abdominal:     General: There is no distension.     Palpations: Abdomen is soft.     Tenderness: There is no abdominal tenderness. There is no guarding.  Musculoskeletal: Normal range of motion.        General: No deformity.  Lymphadenopathy:     Cervical: No cervical adenopathy.  Skin:    General: Skin is warm and dry.     Findings: No erythema or rash.  Neurological:     Mental Status: She is alert.     GCS: GCS eye subscore is 4. GCS verbal subscore is 5. GCS motor subscore is 6.     Comments: Mental Status:  Alert, oriented, thought content appropriate, able to give a coherent history. Speech fluent without evidence of aphasia. Able to follow 2 step commands without difficulty.   Cranial Nerves:  II:  Peripheral visual fields grossly normal, pupils equal, round, reactive to light III,IV, VI: ptosis not present, extra-ocular motions intact bilaterally  V,VII: smile symmetric, facial light touch sensation equal VIII: hearing grossly normal to voice  X: uvula elevates symmetrically  XI: bilateral shoulder shrug symmetric and strong XII: midline tongue extension without fassiculations Motor:  Normal tone. 5/5 in upper and lower extremities bilaterally including strong and equal grip strength and dorsiflexion/plantar flexion Sensory: Pinprick and light touch normal in all extremities.  Cerebellar: normal finger-to-nose with bilateral upper extremities Gait: normal gait and balance Stance: No pronator drift and good coordination, strength, and position  sense with tapping of bilateral arms (performed in sitting position). CV: distal pulses palpable throughout    Psychiatric:        Behavior: Behavior normal.        Thought Content: Thought content normal.        Judgment: Judgment normal.      ED Treatments / Results  Labs (all labs ordered are listed, but only abnormal results are displayed) Labs Reviewed  I-STAT BETA HCG BLOOD, ED ([facility], [facility], [facility] ONLY)    EKG None  Radiology No results found.  Procedures Procedures (including critical care time)  Medications Ordered in ED Medications  sodium chloride 0.9 % bolus 1,000 mL (has no administration in time range)  prochlorperazine (COMPAZINE) injection 10 mg (has no administration in time range)  acetaminophen (TYLENOL) tablet 1,000 mg (has no administration in time range)     Initial Impression / Assessment and Plan / ED Course  I have reviewed the triage vital signs and the nursing notes.  Pertinent labs & imaging results that were available during my care of the patient were reviewed by me and considered in my medical decision making (see chart for details).  Clinical Course as of Nov 22 451  Mon  Nov 22, 2018  1224 I was informed by the RN that patient stated she wanted to leave wanted to . Possible compazine reaction. Discussed with patient prior to administration use of Benadryl but patient drove herself.   [AM]    Clinical Course User Index [AM] Elisha Ponder, PA-C       Patient is nontoxic-appearing, afebrile, neurologically intact.  No tachycardia, tachypnea nor hypoxia.  She has a normal lung exam.  Patient is describing pain and a tension type pattern along her head.  She has no red flag signs on exam or history.  Do not suspect meningitis, SAH, venous sinus thrombosis, and there are no red flag signs for increased intracranial pressure from the tumor.  Will treat patient with migraine cocktail and reassess.  Regarding patient's shortness of breath, she has a normal lung exam without wheezing.  Patient appears asymptomatic of this at this time, reports that her albuterol treatment earlier today helped.  Notified by RN that patient wanted to leave.  She had only discussed medication treatment.  RN notified provider that patient had left after patient eloped waiting room.  Unable to reassess. RN reviewed AMA with patient.   Final Clinical Impressions(s) / ED Diagnoses   Final diagnoses:  Nonintractable headache, unspecified chronicity pattern, unspecified headache type    ED Discharge Orders    None       Delia Chimes 11/22/18 0454    Palumbo, April, MD 11/22/18 4975

## 2018-11-22 NOTE — ED Notes (Addendum)
EMT attempted to stop pt from leaving. Explained that the EDP could come talk to her. Pt declined. Pt walked to waiting room with sweatshirt covering IV. This RN walked to waiting room to take out her IV. RN again explained that EDP could talk to her and asked pt to stay. Pt stated that she was getting agitated and "wanted to go home." EDP notified.

## 2019-01-05 ENCOUNTER — Encounter (HOSPITAL_BASED_OUTPATIENT_CLINIC_OR_DEPARTMENT_OTHER): Payer: Self-pay | Admitting: Emergency Medicine

## 2019-01-05 ENCOUNTER — Emergency Department (HOSPITAL_BASED_OUTPATIENT_CLINIC_OR_DEPARTMENT_OTHER): Payer: BC Managed Care – PPO

## 2019-01-05 ENCOUNTER — Emergency Department (HOSPITAL_BASED_OUTPATIENT_CLINIC_OR_DEPARTMENT_OTHER)
Admission: EM | Admit: 2019-01-05 | Discharge: 2019-01-05 | Disposition: A | Discharge status: Home / Self Care | Payer: BC Managed Care – PPO | Attending: Emergency Medicine | Admitting: Emergency Medicine

## 2019-01-05 ENCOUNTER — Other Ambulatory Visit: Payer: Self-pay

## 2019-01-05 DIAGNOSIS — Z20828 Contact with and (suspected) exposure to other viral communicable diseases: Secondary | ICD-10-CM | POA: Diagnosis not present

## 2019-01-05 DIAGNOSIS — J069 Acute upper respiratory infection, unspecified: Secondary | ICD-10-CM | POA: Diagnosis not present

## 2019-01-05 DIAGNOSIS — J45909 Unspecified asthma, uncomplicated: Secondary | ICD-10-CM | POA: Diagnosis not present

## 2019-01-05 DIAGNOSIS — R51 Headache: Secondary | ICD-10-CM | POA: Diagnosis present

## 2019-01-05 DIAGNOSIS — Z79899 Other long term (current) drug therapy: Secondary | ICD-10-CM | POA: Insufficient documentation

## 2019-01-05 MED ORDER — KETOROLAC TROMETHAMINE 30 MG/ML IJ SOLN
30.0000 mg | Freq: Once | INTRAMUSCULAR | Status: AC
  Administered 2019-01-05: 30 mg via INTRAMUSCULAR
  Filled 2019-01-05: qty 1

## 2019-01-05 MED ORDER — METOCLOPRAMIDE HCL 5 MG/ML IJ SOLN
10.0000 mg | Freq: Once | INTRAMUSCULAR | Status: AC
  Administered 2019-01-05: 10 mg via INTRAMUSCULAR
  Filled 2019-01-05: qty 2

## 2019-01-05 NOTE — ED Provider Notes (Signed)
[facility] EMERGENCY DEPARTMENT Provider Note   CSN: [id number] Arrival date & time: 01/05/19  1250    History   Chief Complaint Chief Complaint  Patient presents with  . Headache  . Chills  . Nasal Congestion    HPI Anna Glenn is a 23 y.o. female.     HPI Patient presents with a headache.  Has had a headache over the last month or 2.  Seen in ER for same.  It is dull over her frontal head.  Now has congestion in her nose and has had some chills.  No fever.  Occasional dry cough.  States her employer needs a negative COVID test to be able go back to work.  No numbness weakness.  No confusion. Past Medical History:  Diagnosis Date  . Asthma   . BV (bacterial vaginosis)   . Chlamydia     There are no active problems to display for this patient.   History reviewed. No pertinent surgical history.   OB History   No obstetric history on file.      Home Medications    Prior to Admission medications   Medication Sig Start Date End Date Taking? Authorizing Provider  albuterol (PROVENTIL HFA) 108 (90 Base) MCG/ACT inhaler Inhale 2 puffs into the lungs every 6 (six) hours as needed for wheezing or shortness of breath.    [provider]  benzonatate (TESSALON) 100 MG capsule Take 1 capsule (100 mg total) by mouth every 8 (eight) hours. 08/01/18   Fayrene Helperran, Bowie, PA-C  celecoxib (CELEBREX) 200 MG capsule Take 1 capsule (200 mg total) by mouth 2 (two) times daily. Patient not taking: Reported on 04/28/2018 09/21/17   [name], Mario, PA-C  fluticasone [facility](FLONASE) 50 MCG/ACT nasal spray Place 2 sprays into both nostrils daily.    [provider]  ibuprofen (ADVIL,MOTRIN) 600 MG tablet Take 1 tablet (600 mg total) by mouth every 6 (six) hours as needed. 08/01/18   Fayrene Helperran, Bowie, PA-C  montelukast (SINGULAIR) 10 MG tablet Take 10 mg by mouth at bedtime.    [provider]  oseltamivir (TAMIFLU) 75 MG capsule Take 1 capsule (75 mg total) by mouth every  12 (twelve) hours. 08/01/18   Fayrene Helperran, Bowie, PA-C  tranexamic acid (LYSTEDA) 650 MG TABS tablet Take 2 tablets (1,300 mg total) by mouth 3 (three) times daily. Patient not taking: Reported on 04/28/2018 09/21/17   [name], Mario, PA-C    Family History History reviewed. No pertinent family history.  Social History Social History   Tobacco Use  . Smoking status: Never Smoker  . Smokeless tobacco: Never Used  Substance Use Topics  . Alcohol use: Yes    Alcohol/week: 2.0 standard drinks    Types: 2 Glasses of wine per week  . Drug use: Never     Allergies   Patient has no known allergies.   Review of Systems Review of Systems  Constitutional: Positive for chills. Negative for appetite change and fever.  HENT: Positive for congestion.   Respiratory: Positive for cough. Negative for shortness of breath.   Cardiovascular: Negative for chest pain.  Gastrointestinal: Negative for abdominal pain.  Genitourinary: Negative for dyspareunia.  Skin: Negative for rash.  Neurological: Positive for headaches.  Hematological: Negative for adenopathy.  Psychiatric/Behavioral: Negative for confusion.     Physical Exam Updated Vital Signs BP 126/75 (BP Location: Right Arm)   Pulse 70   Temp 98.3 F (36.8 C) (Oral)   Resp 14   Ht 5\' 7"  (  1.702 m)   Wt 108.9 kg   LMP 12/10/2018   SpO2 100%   BMI 37.59 kg/m   Physical Exam Vitals signs and nursing note reviewed.  HENT:     Head: Atraumatic.  Eyes:     Pupils: Pupils are equal, round, and reactive to light.  Cardiovascular:     Rate and Rhythm: Regular rhythm.  Pulmonary:     Breath sounds: No wheezing, rhonchi or rales.  Abdominal:     Tenderness: There is no abdominal tenderness.  Skin:    General: Skin is warm.  Neurological:     Mental Status: She is alert.  Psychiatric:        Mood and Affect: Mood normal.      ED Treatments / Results  Labs (all labs ordered are listed, but only abnormal results are displayed) Labs  Reviewed  NOVEL CORONAVIRUS, NAA (HOSPITAL ORDER, SEND-OUT TO REF LAB)    EKG None  Radiology Ct Head Wo Contrast  Result Date: 01/05/2019 CLINICAL DATA:  Bilateral temporal headache. EXAM: CT HEAD WITHOUT CONTRAST TECHNIQUE: Contiguous axial images were obtained from the base of the skull through the vertex without intravenous contrast. COMPARISON:  None. FINDINGS: Brain: Ventricles are normal in size and configuration. There is no intracranial mass, hemorrhage, extra-axial fluid collection, or midline shift. Brain parenchyma appears unremarkable. There is no evident acute infarct. Vascular: There is no appreciable hyperdense vessel. There is no evident vascular calcification. Skull: The bony calvarium appears intact. Sinuses/Orbits: There is mucosal thickening in several ethmoid air cells. Other visualized paranasal sinuses are clear. Visualized orbits appear symmetric bilaterally. Other: Mastoid air cells are clear. IMPRESSION: Mucosal thickening in several ethmoid air cells. Study otherwise unremarkable. Electronically Signed   By: Bretta Bang III M.D.   On: 01/05/2019 13:56    Procedures Procedures (including critical care time)  Medications Ordered in ED Medications  ketorolac (TORADOL) 30 MG/ML injection 30 mg (30 mg Intramuscular Given 01/05/19 1352)  metoCLOPramide (REGLAN) injection 10 mg (10 mg Intramuscular Given 01/05/19 1352)     Initial Impression / Assessment and Plan / ED Course  I have reviewed the triage vital signs and the nursing notes.  Pertinent labs & imaging results that were available during my care of the patient were reviewed by me and considered in my medical decision making (see chart for details).        Patient with some URI symptoms.  Headache over the last month.  Head CT reassuring.  Feels somewhat better after treatment.  I have ordered the [facility] COVID test which can be followed as an outpatient.  Work note given.  Patient will isolate. Final  Clinical Impressions(s) / ED Diagnoses   Final diagnoses:  Upper respiratory tract infection, unspecified type    ED Discharge Orders    None       Benjiman Core, MD 01/05/19 1457

## 2019-01-05 NOTE — ED Triage Notes (Signed)
Pt here with tension headache x 1 month with nasal congestion and chills x 2days. Pt states that her workplace would like a covid test before she returns to work.

## 2019-01-06 LAB — NOVEL CORONAVIRUS, NAA (HOSP ORDER, SEND-OUT TO REF LAB; TAT 18-24 HRS): SARS-CoV-2, NAA: NOT DETECTED

## 2019-02-23 ENCOUNTER — Emergency Department (HOSPITAL_BASED_OUTPATIENT_CLINIC_OR_DEPARTMENT_OTHER)
Admission: EM | Admit: 2019-02-23 | Discharge: 2019-02-23 | Disposition: A | Discharge status: Home / Self Care | Payer: BC Managed Care – PPO | Attending: Emergency Medicine | Admitting: Emergency Medicine

## 2019-02-23 ENCOUNTER — Encounter (HOSPITAL_BASED_OUTPATIENT_CLINIC_OR_DEPARTMENT_OTHER): Payer: Self-pay | Admitting: Emergency Medicine

## 2019-02-23 ENCOUNTER — Other Ambulatory Visit: Payer: Self-pay

## 2019-02-23 DIAGNOSIS — G44209 Tension-type headache, unspecified, not intractable: Secondary | ICD-10-CM | POA: Diagnosis not present

## 2019-02-23 DIAGNOSIS — J45909 Unspecified asthma, uncomplicated: Secondary | ICD-10-CM | POA: Diagnosis not present

## 2019-02-23 DIAGNOSIS — R51 Headache: Secondary | ICD-10-CM | POA: Diagnosis present

## 2019-02-23 HISTORY — DX: Headache, unspecified: R51.9

## 2019-02-23 LAB — URINALYSIS, ROUTINE W REFLEX MICROSCOPIC
Bilirubin Urine: NEGATIVE
Glucose, UA: NEGATIVE mg/dL
Hgb urine dipstick: NEGATIVE
Ketones, ur: NEGATIVE mg/dL
Nitrite: NEGATIVE
Protein, ur: NEGATIVE mg/dL
Specific Gravity, Urine: 1.025 (ref 1.005–1.030)
pH: 6 (ref 5.0–8.0)

## 2019-02-23 LAB — URINALYSIS, MICROSCOPIC (REFLEX)

## 2019-02-23 LAB — PREGNANCY, URINE: Preg Test, Ur: NEGATIVE

## 2019-02-23 MED ORDER — DIPHENHYDRAMINE HCL 50 MG/ML IJ SOLN: 25.0000 mg | Freq: Once | INTRAMUSCULAR | Status: DC

## 2019-02-23 MED ORDER — PROCHLORPERAZINE EDISYLATE 10 MG/2ML IJ SOLN: 10.0000 mg | Freq: Once | INTRAMUSCULAR | Status: DC

## 2019-02-23 MED ORDER — PROMETHAZINE HCL 25 MG/ML IJ SOLN
25.0000 mg | Freq: Once | INTRAMUSCULAR | Status: AC
  Administered 2019-02-23: 25 mg via INTRAMUSCULAR
  Filled 2019-02-23: qty 1

## 2019-02-23 MED ORDER — SODIUM CHLORIDE 0.9 % IV BOLUS: 1000.0000 mL | Freq: Once | INTRAVENOUS | Status: DC

## 2019-02-23 NOTE — ED Triage Notes (Signed)
Pt reports a constant headache that is "throbbing on my temples" since she was here last for the same thing about a month ago. Sts she took Aleve once but it didn't help.  Sts she is in [place] for school and her pmd is in [place] so she has not seen a primary doctor.

## 2019-02-23 NOTE — ED Provider Notes (Signed)
[facility] Emergency Department Provider Note MRN:  [id number]  Arrival date & time: 02/23/19     Chief Complaint   Headache   History of Present Illness   Anna Glenn is a 23 y.o. year-old female with no pertinent past medical history presenting to the ED with chief complaint of headache.  Patient is here for recurrence of headache.  Explains that she was here a month ago with headache, chills, viral symptoms, was given medications, headache went away.  Headache returned 4 to 5 days ago, gradual onset, located in the forehead in a bandlike distribution around the head, denies nausea or vomiting, no numbness or weakness to the arms or legs, no neck pain, no fever, no IV drug use.  Denies any other symptoms, headache is moderate in severity, constant, no exacerbating relieving factors.  Review of Systems  A complete 10 system review of systems was obtained and all systems are negative except as noted in the HPI and PMH.   Patient's Health History    Past Medical History:  Diagnosis Date  . Asthma   . BV (bacterial vaginosis)   . Chlamydia   . Headache     Past Surgical History:  Procedure Laterality Date  . HEMORRHOID SURGERY      No family history on file.  Social History   Socioeconomic History  . Marital status: Single    Spouse name: Not on file  . Number of children: Not on file  . Years of education: Not on file  . Highest education level: Not on file  Occupational History  . Not on file  Social Needs  . Financial resource strain: Not on file  . Food insecurity    Worry: Not on file    Inability: Not on file  . Transportation needs    Medical: Not on file    Non-medical: Not on file  Tobacco Use  . Smoking status: Never Smoker  . Smokeless tobacco: Never Used  Substance and Sexual Activity  . Alcohol use: Yes    Alcohol/week: 2.0 standard drinks    Types: 2 Glasses of wine per week    Comment: weekends  . Drug use: Never   . Sexual activity: Not on file  Lifestyle  . Physical activity    Days per week: Not on file    Minutes per session: Not on file  . Stress: Not on file  Relationships  . Social Musicianconnections    Talks on phone: Not on file    Gets together: Not on file    Attends religious service: Not on file    Active member of club or organization: Not on file    Attends meetings of clubs or organizations: Not on file    Relationship status: Not on file  . Intimate partner violence    Fear of current or ex partner: Not on file    Emotionally abused: Not on file    Physically abused: Not on file    Forced sexual activity: Not on file  Other Topics Concern  . Not on file  Social History Narrative  . Not on file     Physical Exam  Vital Signs and Nursing Notes reviewed Vitals:   02/23/19 1041 02/23/19 1044  BP: 125/75 125/75  Pulse:  60  Resp:  16  Temp:  99 F (37.2 C)  SpO2:  100%    CONSTITUTIONAL: Well-appearing, NAD NEURO:  Alert and oriented x 3, normal and symmetric strength  and sensation, normal coordination, normal speech EYES:  eyes equal and reactive ENT/NECK:  no LAD, no JVD CARDIO: Regular rate, well-perfused, normal S1 and S2 PULM:  CTAB no wheezing or rhonchi GI/GU:  normal bowel sounds, non-distended, non-tender MSK/SPINE:  No gross deformities, no edema SKIN:  no rash, atraumatic PSYCH:  Appropriate speech and behavior  Diagnostic and Interventional Summary    Labs Reviewed  URINALYSIS, ROUTINE W REFLEX MICROSCOPIC - Abnormal; Notable for the following components:      Result Value   Leukocytes,Ua TRACE (*)    All other components within normal limits  URINALYSIS, MICROSCOPIC (REFLEX) - Abnormal; Notable for the following components:   Bacteria, UA FEW (*)    All other components within normal limits  PREGNANCY, URINE    No orders to display    Medications  promethazine (PHENERGAN) injection 25 mg (25 mg Intramuscular Given 02/23/19 1135)     Procedures  Critical Care  ED Course and Medical Decision Making  I have reviewed the triage vital signs and the nursing notes.  Pertinent labs & imaging results that were available during my care of the patient were reviewed by me and considered in my medical decision making (see below for details).  Migraine cocktail and reassess, 23 year old female otherwise healthy with normal neurological exam, no fever, no meningismus.  Headache was gradual onset, and classic bandlike distribution, nothing to suggest subarachnoid hemorrhage.  Patient's headache is resolved after migraine cocktail, continued reassuring neurological exam, she is appropriate for discharge.  After the discussed management above, the patient was determined to be safe for discharge.  The patient was in agreement with this plan and all questions regarding their care were answered.  ED return precautions were discussed and the patient will return to the ED with any significant worsening of condition.  Barth Kirks. Sedonia Small, [place] [email or web address]  Final Clinical Impressions(s) / ED Diagnoses     ICD-10-CM   1. Acute non intractable tension-type headache  G44.209     ED Discharge Orders    None         Maudie Flakes, MD 02/23/19 1320

## 2019-02-23 NOTE — Discharge Instructions (Signed)
You were evaluated in the Emergency Department and after careful evaluation, we did not find any emergent condition requiring admission or further testing in the hospital.  We recommend more sleep and more fluids at home to help prevent headaches.  Would recommend PCP follow-up.  Please return to the Emergency Department if you experience any worsening of your condition.  We encourage you to follow up with a primary care provider.  Thank you for allowing [place] to be a part of your care.

## 2019-10-14 IMAGING — CT CT HEAD WITHOUT CONTRAST
3 series · 16 of 47 positions shown, 19 images · non-contrast
Comparison: None.

CLINICAL DATA: Bilateral temporal headache.

EXAM:
CT HEAD WITHOUT CONTRAST
TECHNIQUE: Contiguous axial images were obtained from the base of the skull
through the vertex without intravenous contrast.

[Series 2: head wo · axial · 0.40mm/px · z∈[-129,-4]mm · 10 of 31 slices shown, 13 images]
[im 3/31  brain]
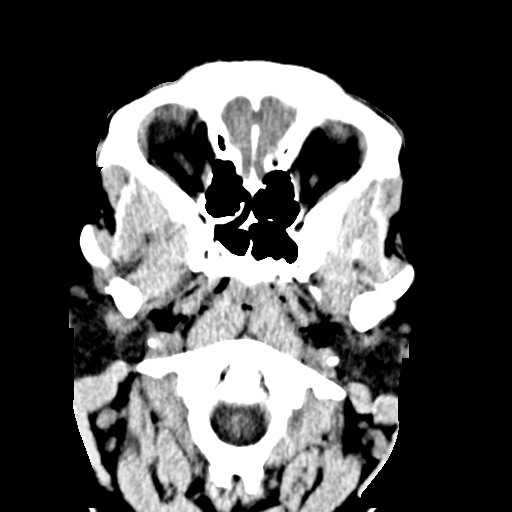
[im 3/31  bone]
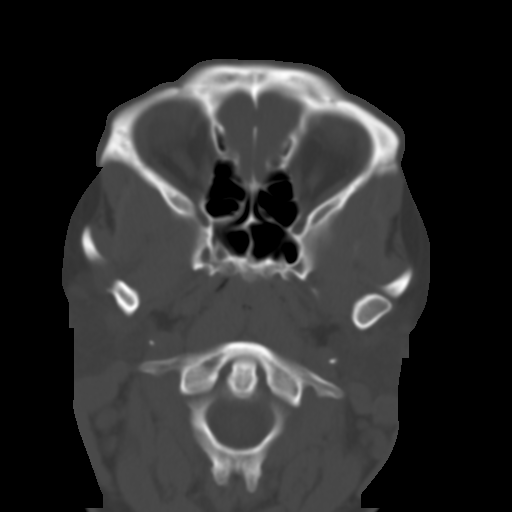
[im 6/31  brain]
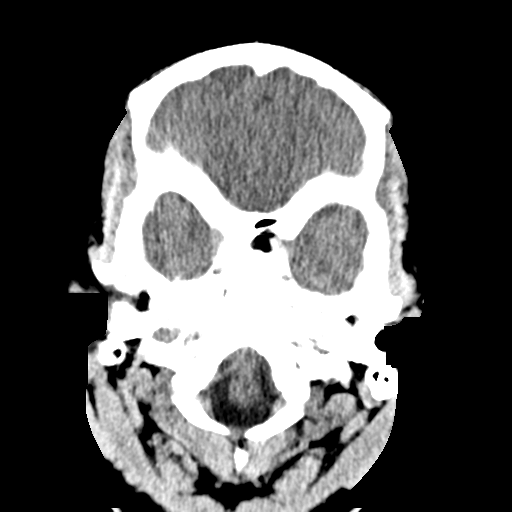
[im 9/31  brain]
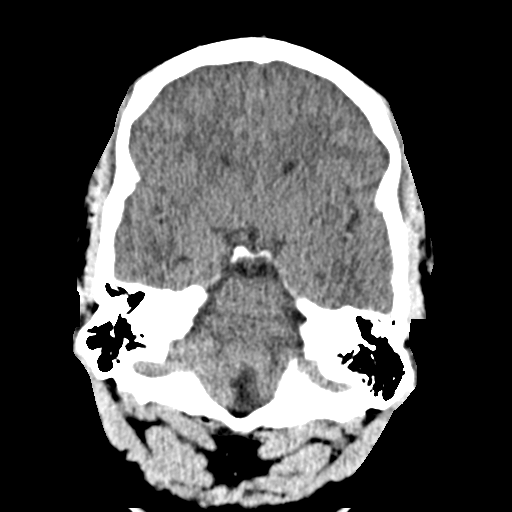
[im 11/31  brain]
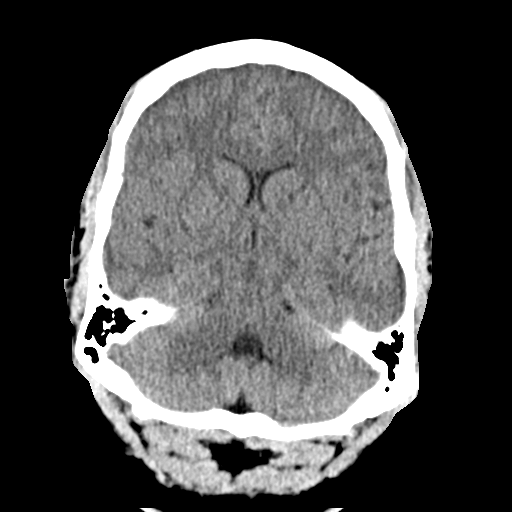
[im 14/31  brain]
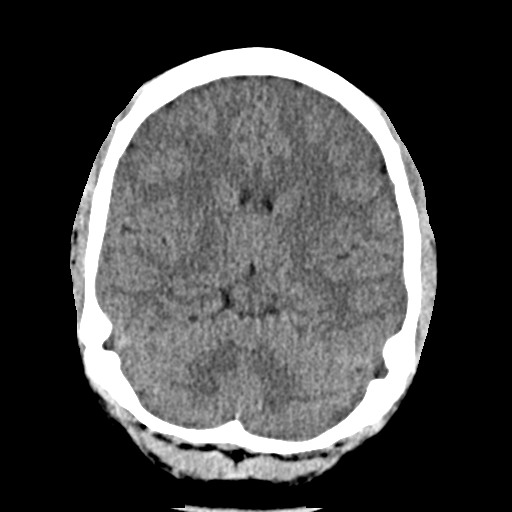
[im 14/31  bone]
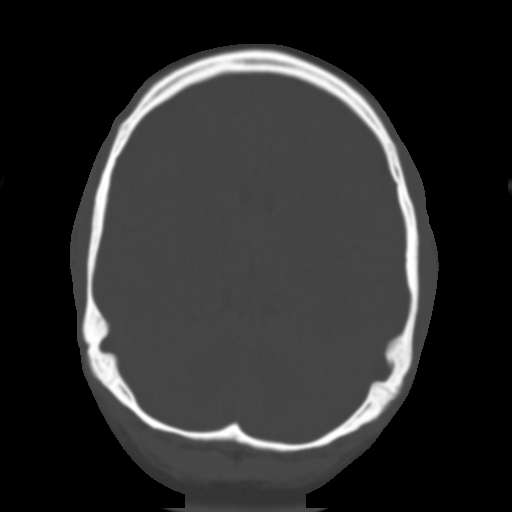
[im 17/31  brain]
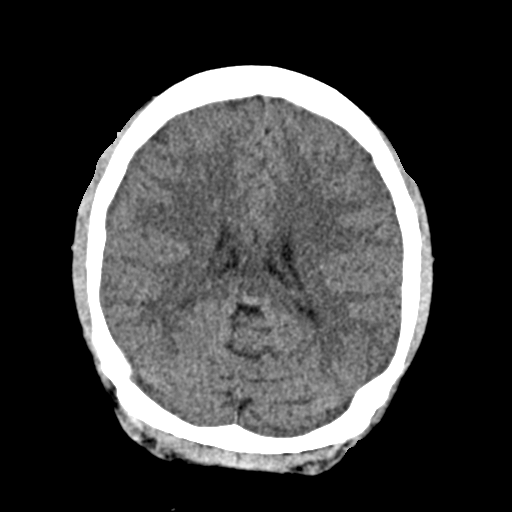
[im 20/31  brain]
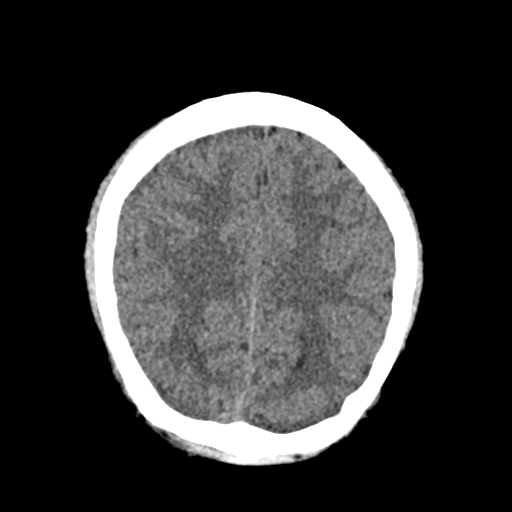
[im 23/31  brain]
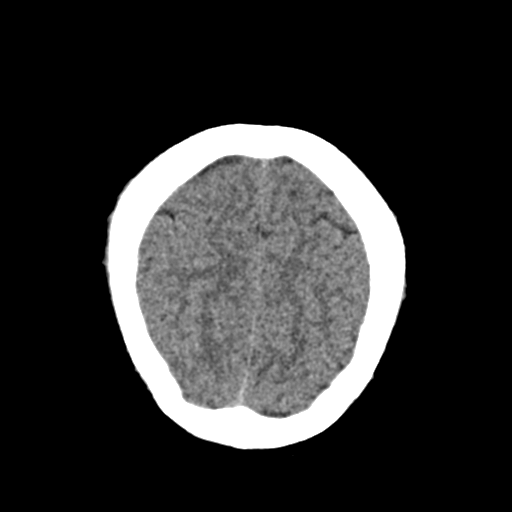
[im 25/31  brain]
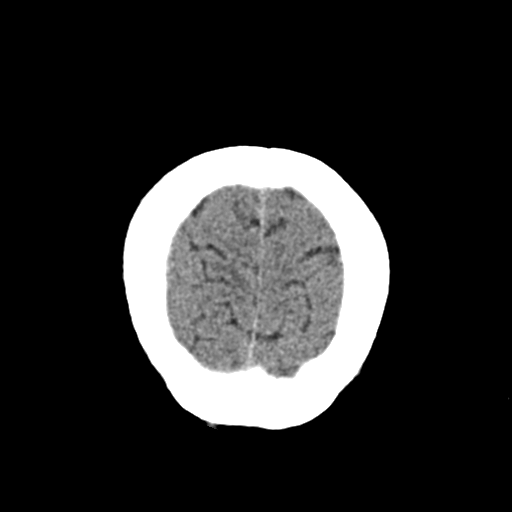
[im 25/31  bone]
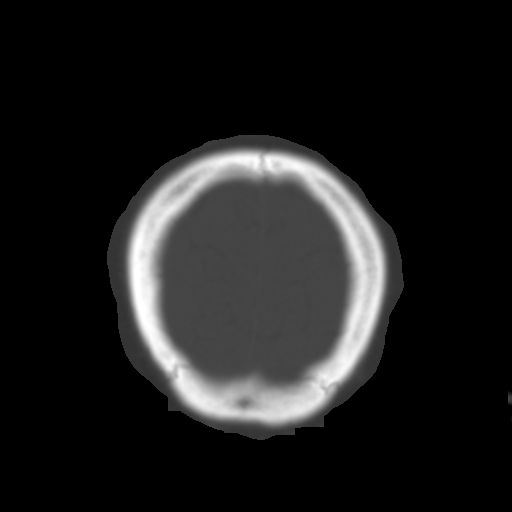
[im 28/31  brain]
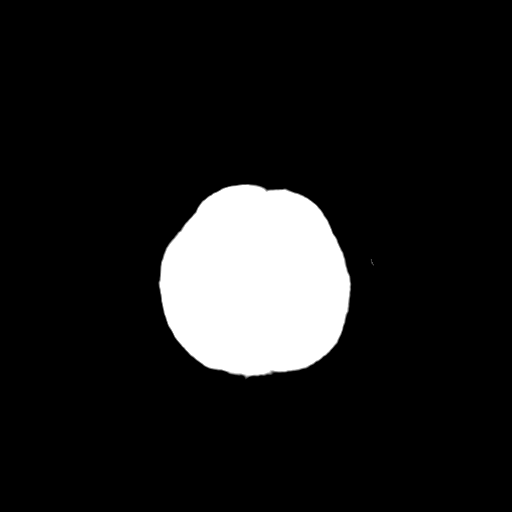

[Series 4: coronal soft · coronal · 0.32mm/px · 3 of 65 slices shown]
[im 22/65  brain]
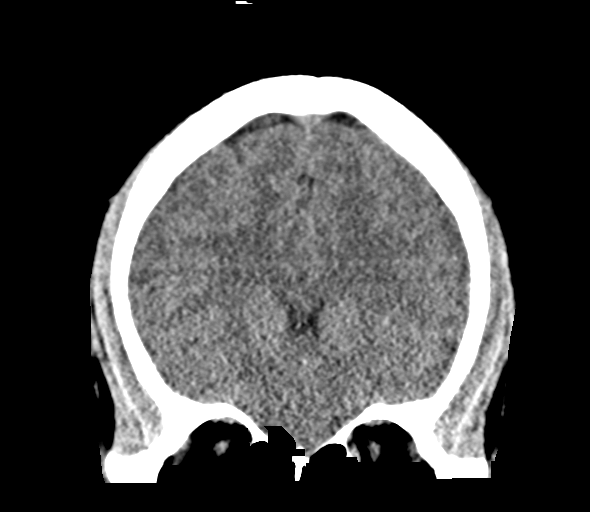
[im 29/65  brain]
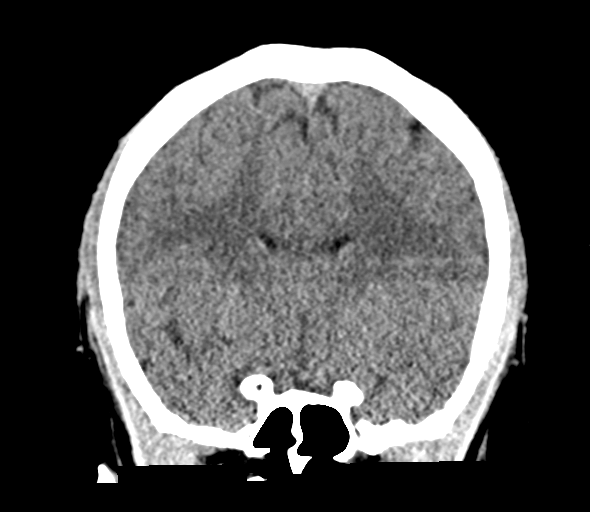
[im 36/65  brain]
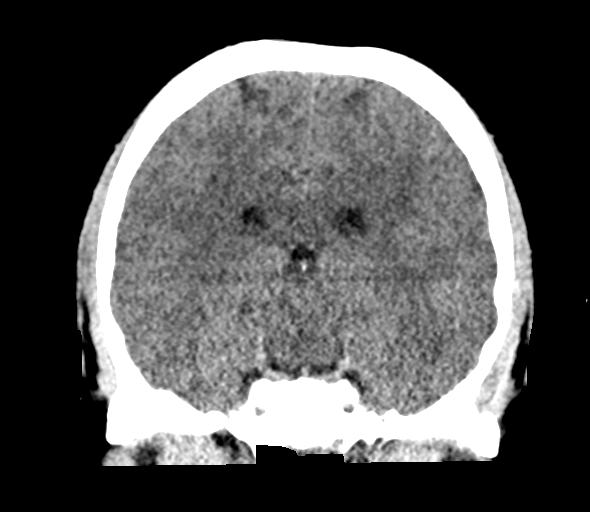

[Series 5: sag soft · sagittal · 0.32mm/px · 3 of 58 slices shown]
[im 20/58  brain]
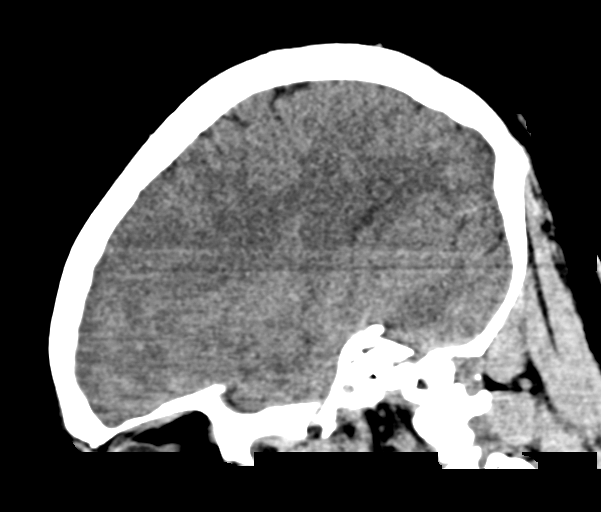
[im 29/58  brain]
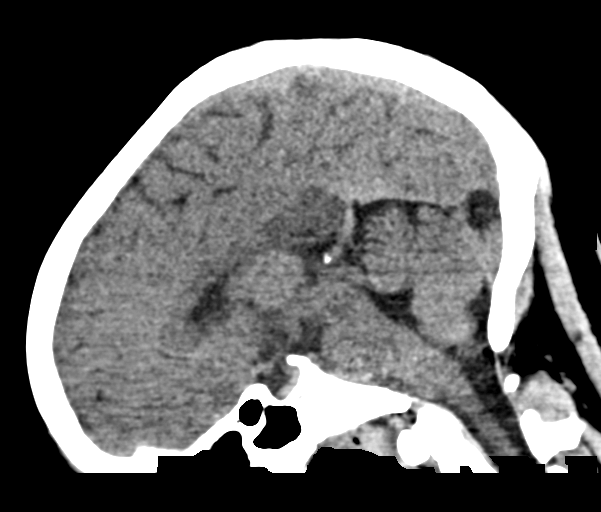
[im 39/58  brain]
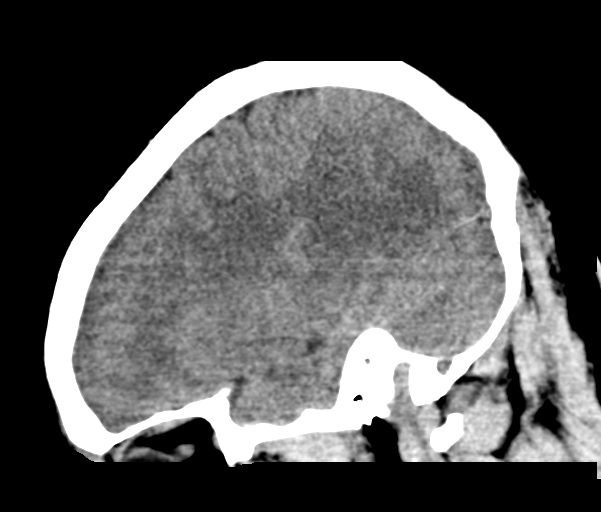

[16 of 47 positions shown; findings below may reference images not displayed]

FINDINGS: Brain: Ventricles are normal in size and configuration. There is no
intracranial mass, hemorrhage, extra-axial fluid collection, or
midline shift. Brain parenchyma appears unremarkable. There is no
evident acute infarct.

Vascular: There is no appreciable hyperdense vessel. There is no
evident vascular calcification.

Skull: The bony calvarium appears intact.

Sinuses/Orbits: There is mucosal thickening in several ethmoid air
cells. Other visualized paranasal sinuses are clear. Visualized
orbits appear symmetric bilaterally.

Other: Mastoid air cells are clear.
IMPRESSION: Mucosal thickening in several ethmoid air cells. Study otherwise
unremarkable.
